# Patient Record
Sex: Female | Born: 1975 | Race: Black or African American | Hispanic: No | Smoking: Never smoker
Health system: Southern US, Community
[De-identification: ages and names within clinical notes are randomized; demographics above are authoritative.]

## PROBLEM LIST (undated history)

## (undated) DIAGNOSIS — Z789 Other specified health status: Secondary | ICD-10-CM

---

## 2019-04-17 ENCOUNTER — Encounter (HOSPITAL_COMMUNITY): Payer: Self-pay | Admitting: Emergency Medicine

## 2019-04-17 ENCOUNTER — Inpatient Hospital Stay (HOSPITAL_COMMUNITY)
Admission: EM | Admit: 2019-04-17 | Discharge: 2019-04-23 | DRG: 492 | Disposition: A | Discharge status: Rehabilitation Facility | Payer: Medicaid Other | Attending: Orthopaedic Surgery | Admitting: Orthopaedic Surgery

## 2019-04-17 DIAGNOSIS — Z823 Family history of stroke: Secondary | ICD-10-CM

## 2019-04-17 DIAGNOSIS — D259 Leiomyoma of uterus, unspecified: Secondary | ICD-10-CM | POA: Diagnosis present

## 2019-04-17 DIAGNOSIS — S82302C Unspecified fracture of lower end of left tibia, initial encounter for open fracture type IIIA, IIIB, or IIIC: Principal | ICD-10-CM | POA: Diagnosis present

## 2019-04-17 DIAGNOSIS — S82832C Other fracture of upper and lower end of left fibula, initial encounter for open fracture type IIIA, IIIB, or IIIC: Secondary | ICD-10-CM | POA: Diagnosis present

## 2019-04-17 DIAGNOSIS — S82402C Unspecified fracture of shaft of left fibula, initial encounter for open fracture type IIIA, IIIB, or IIIC: Secondary | ICD-10-CM

## 2019-04-17 DIAGNOSIS — Z20822 Contact with and (suspected) exposure to covid-19: Secondary | ICD-10-CM | POA: Diagnosis present

## 2019-04-17 DIAGNOSIS — R739 Hyperglycemia, unspecified: Secondary | ICD-10-CM

## 2019-04-17 DIAGNOSIS — D500 Iron deficiency anemia secondary to blood loss (chronic): Secondary | ICD-10-CM | POA: Diagnosis present

## 2019-04-17 DIAGNOSIS — Z6841 Body Mass Index (BMI) 40.0 and over, adult: Secondary | ICD-10-CM

## 2019-04-17 DIAGNOSIS — Z419 Encounter for procedure for purposes other than remedying health state, unspecified: Secondary | ICD-10-CM

## 2019-04-17 DIAGNOSIS — Y92410 Unspecified street and highway as the place of occurrence of the external cause: Secondary | ICD-10-CM

## 2019-04-17 DIAGNOSIS — S62112A Displaced fracture of triquetrum [cuneiform] bone, left wrist, initial encounter for closed fracture: Secondary | ICD-10-CM

## 2019-04-17 DIAGNOSIS — G8918 Other acute postprocedural pain: Secondary | ICD-10-CM

## 2019-04-17 DIAGNOSIS — Z9889 Other specified postprocedural states: Secondary | ICD-10-CM

## 2019-04-17 DIAGNOSIS — D62 Acute posthemorrhagic anemia: Secondary | ICD-10-CM

## 2019-04-17 DIAGNOSIS — Z23 Encounter for immunization: Secondary | ICD-10-CM

## 2019-04-17 DIAGNOSIS — S82202C Unspecified fracture of shaft of left tibia, initial encounter for open fracture type IIIA, IIIB, or IIIC: Secondary | ICD-10-CM | POA: Diagnosis present

## 2019-04-17 HISTORY — DX: Other specified health status: Z78.9

## 2019-04-17 MED ORDER — CEFAZOLIN SODIUM-DEXTROSE 1-4 GM/50ML-% IV SOLN
1.0000 g | Freq: Once | INTRAVENOUS | Status: AC
  Administered 2019-04-18: 1 g via INTRAVENOUS

## 2019-04-17 MED ORDER — SODIUM CHLORIDE 0.9 % IV SOLN: INTRAVENOUS | Status: DC

## 2019-04-17 MED ORDER — TETANUS-DIPHTH-ACELL PERTUSSIS 5-2.5-18.5 LF-MCG/0.5 IM SUSP
0.5000 mL | Freq: Once | INTRAMUSCULAR | Status: AC
  Administered 2019-04-18: 0.5 mL via INTRAMUSCULAR
  Filled 2019-04-17: qty 0.5

## 2019-04-17 MED ORDER — SODIUM CHLORIDE 0.9 % IV BOLUS
1000.0000 mL | Freq: Once | INTRAVENOUS | Status: AC
  Administered 2019-04-18: 1000 mL via INTRAVENOUS

## 2019-04-17 MED ORDER — CEFAZOLIN SODIUM-DEXTROSE 2-4 GM/100ML-% IV SOLN
2.0000 g | Freq: Once | INTRAVENOUS | Status: DC
  Filled 2019-04-17: qty 100

## 2019-04-17 MED ORDER — MORPHINE SULFATE (PF) 4 MG/ML IV SOLN
4.0000 mg | Freq: Once | INTRAVENOUS | Status: AC
  Administered 2019-04-18: 4 mg via INTRAVENOUS
  Filled 2019-04-17: qty 1
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
## 2019-04-18 ENCOUNTER — Emergency Department (HOSPITAL_COMMUNITY): Payer: Medicaid Other

## 2019-04-18 ENCOUNTER — Emergency Department (HOSPITAL_COMMUNITY): Payer: Medicaid Other | Admitting: Anesthesiology

## 2019-04-18 ENCOUNTER — Encounter (HOSPITAL_COMMUNITY)
Admission: EM | Disposition: A | Discharge status: Rehabilitation Facility | Payer: Self-pay | Source: Home / Self Care | Attending: Orthopaedic Surgery

## 2019-04-18 DIAGNOSIS — Z9889 Other specified postprocedural states: Secondary | ICD-10-CM

## 2019-04-18 DIAGNOSIS — S82202C Unspecified fracture of shaft of left tibia, initial encounter for open fracture type IIIA, IIIB, or IIIC: Secondary | ICD-10-CM | POA: Diagnosis present

## 2019-04-18 DIAGNOSIS — Z23 Encounter for immunization: Secondary | ICD-10-CM | POA: Diagnosis not present

## 2019-04-18 DIAGNOSIS — D500 Iron deficiency anemia secondary to blood loss (chronic): Secondary | ICD-10-CM | POA: Diagnosis present

## 2019-04-18 DIAGNOSIS — S82209D Unspecified fracture of shaft of unspecified tibia, subsequent encounter for closed fracture with routine healing: Secondary | ICD-10-CM | POA: Diagnosis not present

## 2019-04-18 DIAGNOSIS — S82401D Unspecified fracture of shaft of right fibula, subsequent encounter for closed fracture with routine healing: Secondary | ICD-10-CM | POA: Diagnosis not present

## 2019-04-18 DIAGNOSIS — D259 Leiomyoma of uterus, unspecified: Secondary | ICD-10-CM | POA: Diagnosis present

## 2019-04-18 DIAGNOSIS — S62112A Displaced fracture of triquetrum [cuneiform] bone, left wrist, initial encounter for closed fracture: Secondary | ICD-10-CM

## 2019-04-18 DIAGNOSIS — R739 Hyperglycemia, unspecified: Secondary | ICD-10-CM | POA: Diagnosis not present

## 2019-04-18 DIAGNOSIS — F329 Major depressive disorder, single episode, unspecified: Secondary | ICD-10-CM | POA: Diagnosis not present

## 2019-04-18 DIAGNOSIS — S82402C Unspecified fracture of shaft of left fibula, initial encounter for open fracture type IIIA, IIIB, or IIIC: Secondary | ICD-10-CM | POA: Diagnosis not present

## 2019-04-18 DIAGNOSIS — S82402S Unspecified fracture of shaft of left fibula, sequela: Secondary | ICD-10-CM | POA: Diagnosis not present

## 2019-04-18 DIAGNOSIS — G8918 Other acute postprocedural pain: Secondary | ICD-10-CM | POA: Diagnosis not present

## 2019-04-18 DIAGNOSIS — S82201D Unspecified fracture of shaft of right tibia, subsequent encounter for closed fracture with routine healing: Secondary | ICD-10-CM | POA: Diagnosis not present

## 2019-04-18 DIAGNOSIS — S82302C Unspecified fracture of lower end of left tibia, initial encounter for open fracture type IIIA, IIIB, or IIIC: Secondary | ICD-10-CM | POA: Diagnosis present

## 2019-04-18 DIAGNOSIS — Z823 Family history of stroke: Secondary | ICD-10-CM | POA: Diagnosis not present

## 2019-04-18 DIAGNOSIS — S82202E Unspecified fracture of shaft of left tibia, subsequent encounter for open fracture type I or II with routine healing: Secondary | ICD-10-CM | POA: Diagnosis not present

## 2019-04-18 DIAGNOSIS — Z20822 Contact with and (suspected) exposure to covid-19: Secondary | ICD-10-CM | POA: Diagnosis present

## 2019-04-18 DIAGNOSIS — S82292E Other fracture of shaft of left tibia, subsequent encounter for open fracture type I or II with routine healing: Secondary | ICD-10-CM | POA: Diagnosis not present

## 2019-04-18 DIAGNOSIS — D62 Acute posthemorrhagic anemia: Secondary | ICD-10-CM | POA: Diagnosis not present

## 2019-04-18 DIAGNOSIS — S82832C Other fracture of upper and lower end of left fibula, initial encounter for open fracture type IIIA, IIIB, or IIIC: Secondary | ICD-10-CM | POA: Diagnosis present

## 2019-04-18 DIAGNOSIS — S82409D Unspecified fracture of shaft of unspecified fibula, subsequent encounter for closed fracture with routine healing: Secondary | ICD-10-CM | POA: Diagnosis not present

## 2019-04-18 DIAGNOSIS — S82202S Unspecified fracture of shaft of left tibia, sequela: Secondary | ICD-10-CM | POA: Diagnosis not present

## 2019-04-18 DIAGNOSIS — K5901 Slow transit constipation: Secondary | ICD-10-CM | POA: Diagnosis not present

## 2019-04-18 DIAGNOSIS — Y92410 Unspecified street and highway as the place of occurrence of the external cause: Secondary | ICD-10-CM | POA: Diagnosis not present

## 2019-04-18 DIAGNOSIS — Z6841 Body Mass Index (BMI) 40.0 and over, adult: Secondary | ICD-10-CM | POA: Diagnosis not present

## 2019-04-18 HISTORY — PX: I & D EXTREMITY: SHX5045

## 2019-04-18 HISTORY — PX: IM NAILING TIBIA: SUR734

## 2019-04-18 HISTORY — PX: TIBIA IM NAIL INSERTION: SHX2516

## 2019-04-18 LAB — CBC
HCT: 30.2 % — ABNORMAL LOW (ref 36.0–46.0)
HCT: 30.7 % — ABNORMAL LOW (ref 36.0–46.0)
Hemoglobin: 8.7 g/dL — ABNORMAL LOW (ref 12.0–15.0)
Hemoglobin: 9 g/dL — ABNORMAL LOW (ref 12.0–15.0)
MCH: 22.8 pg — ABNORMAL LOW (ref 26.0–34.0)
MCH: 23.3 pg — ABNORMAL LOW (ref 26.0–34.0)
MCHC: 28.8 g/dL — ABNORMAL LOW (ref 30.0–36.0)
MCHC: 29.3 g/dL — ABNORMAL LOW (ref 30.0–36.0)
MCV: 79.1 fL — ABNORMAL LOW (ref 80.0–100.0)
MCV: 79.3 fL — ABNORMAL LOW (ref 80.0–100.0)
Platelets: 195 10*3/uL (ref 150–400)
Platelets: 211 10*3/uL (ref 150–400)
RBC: 3.82 MIL/uL — ABNORMAL LOW (ref 3.87–5.11)
RBC: 3.87 MIL/uL (ref 3.87–5.11)
RDW: 17.8 % — ABNORMAL HIGH (ref 11.5–15.5)
RDW: 17.9 % — ABNORMAL HIGH (ref 11.5–15.5)
WBC: 6.5 10*3/uL (ref 4.0–10.5)
WBC: 8.4 10*3/uL (ref 4.0–10.5)
nRBC: 0 % (ref 0.0–0.2)
nRBC: 0 % (ref 0.0–0.2)

## 2019-04-18 LAB — I-STAT CHEM 8, ED
BUN: 15 mg/dL (ref 6–20)
Calcium, Ion: 1.21 mmol/L (ref 1.15–1.40)
Chloride: 105 mmol/L (ref 98–111)
Creatinine, Ser: 0.8 mg/dL (ref 0.44–1.00)
Glucose, Bld: 109 mg/dL — ABNORMAL HIGH (ref 70–99)
HCT: 31 % — ABNORMAL LOW (ref 36.0–46.0)
Hemoglobin: 10.5 g/dL — ABNORMAL LOW (ref 12.0–15.0)
Potassium: 3.8 mmol/L (ref 3.5–5.1)
Sodium: 139 mmol/L (ref 135–145)
TCO2: 27 mmol/L (ref 22–32)

## 2019-04-18 LAB — COMPREHENSIVE METABOLIC PANEL
ALT: 20 U/L (ref 0–44)
AST: 30 U/L (ref 15–41)
Albumin: 3.4 g/dL — ABNORMAL LOW (ref 3.5–5.0)
Alkaline Phosphatase: 74 U/L (ref 38–126)
Anion gap: 10 (ref 5–15)
BUN: 11 mg/dL (ref 6–20)
CO2: 23 mmol/L (ref 22–32)
Calcium: 8.8 mg/dL — ABNORMAL LOW (ref 8.9–10.3)
Chloride: 105 mmol/L (ref 98–111)
Creatinine, Ser: 0.98 mg/dL (ref 0.44–1.00)
GFR calc Af Amer: >60 mL/min (ref >60)
GFR calc non Af Amer: >60 mL/min (ref >60)
Glucose, Bld: 117 mg/dL — ABNORMAL HIGH (ref 70–99)
Potassium: 3.9 mmol/L (ref 3.5–5.1)
Sodium: 138 mmol/L (ref 135–145)
Total Bilirubin: 0.3 mg/dL (ref 0.3–1.2)
Total Protein: 7 g/dL (ref 6.5–8.1)

## 2019-04-18 LAB — RESPIRATORY PANEL BY RT PCR (FLU A&B, COVID)
Influenza A by PCR: NEGATIVE
Influenza B by PCR: NEGATIVE
SARS Coronavirus 2 by RT PCR: NEGATIVE

## 2019-04-18 LAB — BASIC METABOLIC PANEL
Anion gap: 9 (ref 5–15)
BUN: 7 mg/dL (ref 6–20)
CO2: 23 mmol/L (ref 22–32)
Calcium: 8.7 mg/dL — ABNORMAL LOW (ref 8.9–10.3)
Chloride: 105 mmol/L (ref 98–111)
Creatinine, Ser: 0.84 mg/dL (ref 0.44–1.00)
GFR calc Af Amer: >60 mL/min (ref >60)
GFR calc non Af Amer: >60 mL/min (ref >60)
Glucose, Bld: 149 mg/dL — ABNORMAL HIGH (ref 70–99)
Potassium: 3.9 mmol/L (ref 3.5–5.1)
Sodium: 137 mmol/L (ref 135–145)

## 2019-04-18 LAB — PROTIME-INR
INR: 1 (ref 0.8–1.2)
Prothrombin Time: 13.3 seconds (ref 11.4–15.2)

## 2019-04-18 LAB — SAMPLE TO BLOOD BANK

## 2019-04-18 LAB — ETHANOL: Alcohol, Ethyl (B): <10 mg/dL (ref <10)

## 2019-04-18 LAB — I-STAT BETA HCG BLOOD, ED (MC, WL, AP ONLY): I-stat hCG, quantitative: <5 m[IU]/mL (ref <5)

## 2019-04-18 LAB — LACTIC ACID, PLASMA: Lactic Acid, Venous: 2.4 mmol/L (ref 0.5–1.9)

## 2019-04-18 SURGERY — IRRIGATION AND DEBRIDEMENT EXTREMITY
Anesthesia: General | Laterality: Left

## 2019-04-18 MED ORDER — DIPHENHYDRAMINE HCL 12.5 MG/5ML PO ELIX: 12.5000 mg | ORAL_SOLUTION | ORAL | Status: DC | PRN

## 2019-04-18 MED ORDER — GABAPENTIN 100 MG PO CAPS
100.0000 mg | ORAL_CAPSULE | Freq: Three times a day (TID) | ORAL | Status: DC
  Administered 2019-04-18 – 2019-04-23 (×17): 100 mg via ORAL
  Filled 2019-04-18 (×17): qty 1

## 2019-04-18 MED ORDER — PROPOFOL 10 MG/ML IV BOLUS
INTRAVENOUS | Status: AC
  Filled 2019-04-18: qty 20

## 2019-04-18 MED ORDER — OXYCODONE HCL 5 MG PO TABS
10.0000 mg | ORAL_TABLET | ORAL | Status: DC | PRN
  Administered 2019-04-19 – 2019-04-20 (×4): 15 mg via ORAL
  Administered 2019-04-20: 10 mg via ORAL
  Administered 2019-04-21 (×2): 15 mg via ORAL
  Administered 2019-04-22: 10 mg via ORAL
  Administered 2019-04-22: 12:00:00 15 mg via ORAL
  Filled 2019-04-18 (×7): qty 3
  Filled 2019-04-18 (×2): qty 2
  Filled 2019-04-18 (×2): qty 3

## 2019-04-18 MED ORDER — ROCURONIUM BROMIDE 50 MG/5ML IV SOSY
PREFILLED_SYRINGE | INTRAVENOUS | Status: DC | PRN
  Administered 2019-04-18: 50 mg via INTRAVENOUS

## 2019-04-18 MED ORDER — METOCLOPRAMIDE HCL 5 MG PO TABS: 5.0000 mg | ORAL_TABLET | Freq: Three times a day (TID) | ORAL | Status: DC | PRN

## 2019-04-18 MED ORDER — SODIUM CHLORIDE 0.9 % IV SOLN: INTRAVENOUS | Status: DC

## 2019-04-18 MED ORDER — MIDAZOLAM HCL 2 MG/2ML IJ SOLN
INTRAMUSCULAR | Status: AC
  Filled 2019-04-18: qty 2

## 2019-04-18 MED ORDER — ONDANSETRON HCL 4 MG/2ML IJ SOLN: 4.0000 mg | Freq: Once | INTRAMUSCULAR | Status: DC | PRN

## 2019-04-18 MED ORDER — LIDOCAINE 2% (20 MG/ML) 5 ML SYRINGE
INTRAMUSCULAR | Status: DC | PRN
  Administered 2019-04-18: 90 mg via INTRAVENOUS

## 2019-04-18 MED ORDER — HYDROMORPHONE HCL 1 MG/ML IJ SOLN
INTRAMUSCULAR | Status: AC
  Filled 2019-04-18: qty 1

## 2019-04-18 MED ORDER — CHLORHEXIDINE GLUCONATE CLOTH 2 % EX PADS
6.0000 | MEDICATED_PAD | Freq: Every day | CUTANEOUS | Status: DC
  Administered 2019-04-19 – 2019-04-23 (×5): 6 via TOPICAL

## 2019-04-18 MED ORDER — SUGAMMADEX SODIUM 200 MG/2ML IV SOLN
INTRAVENOUS | Status: DC | PRN
  Administered 2019-04-18: 200 mg via INTRAVENOUS

## 2019-04-18 MED ORDER — FENTANYL CITRATE (PF) 250 MCG/5ML IJ SOLN
INTRAMUSCULAR | Status: AC
  Filled 2019-04-18: qty 5

## 2019-04-18 MED ORDER — LACTATED RINGERS IV SOLN: INTRAVENOUS | Status: DC | PRN

## 2019-04-18 MED ORDER — PROPOFOL 10 MG/ML IV BOLUS
INTRAVENOUS | Status: DC | PRN
  Administered 2019-04-18: 110 mg via INTRAVENOUS

## 2019-04-18 MED ORDER — ONDANSETRON HCL 4 MG/2ML IJ SOLN: 4.0000 mg | Freq: Four times a day (QID) | INTRAMUSCULAR | Status: DC | PRN

## 2019-04-18 MED ORDER — CEFAZOLIN SODIUM-DEXTROSE 2-3 GM-%(50ML) IV SOLR
INTRAVENOUS | Status: DC | PRN
  Administered 2019-04-18: 2 g via INTRAVENOUS

## 2019-04-18 MED ORDER — HYDROMORPHONE HCL 1 MG/ML IJ SOLN
0.5000 mg | INTRAMUSCULAR | Status: DC | PRN
  Administered 2019-04-22: 1 mg via INTRAVENOUS
  Filled 2019-04-18 (×2): qty 1

## 2019-04-18 MED ORDER — IOHEXOL 350 MG/ML SOLN
100.0000 mL | Freq: Once | INTRAVENOUS | Status: AC | PRN
  Administered 2019-04-18: 100 mL via INTRAVENOUS

## 2019-04-18 MED ORDER — METHOCARBAMOL 500 MG PO TABS
500.0000 mg | ORAL_TABLET | Freq: Four times a day (QID) | ORAL | Status: DC | PRN
  Administered 2019-04-18 – 2019-04-23 (×6): 500 mg via ORAL
  Filled 2019-04-18 (×7): qty 1

## 2019-04-18 MED ORDER — DOCUSATE SODIUM 100 MG PO CAPS
100.0000 mg | ORAL_CAPSULE | Freq: Two times a day (BID) | ORAL | Status: DC
  Administered 2019-04-18 – 2019-04-23 (×11): 100 mg via ORAL
  Filled 2019-04-18 (×11): qty 1

## 2019-04-18 MED ORDER — METHOCARBAMOL 1000 MG/10ML IJ SOLN: 500.0000 mg | Freq: Four times a day (QID) | INTRAVENOUS | Status: DC | PRN

## 2019-04-18 MED ORDER — HYDROMORPHONE HCL 1 MG/ML IJ SOLN
0.2500 mg | INTRAMUSCULAR | Status: DC | PRN
  Administered 2019-04-18 (×2): 0.5 mg via INTRAVENOUS

## 2019-04-18 MED ORDER — CEFAZOLIN SODIUM-DEXTROSE 2-4 GM/100ML-% IV SOLN
2.0000 g | Freq: Four times a day (QID) | INTRAVENOUS | Status: AC
  Administered 2019-04-18 (×3): 2 g via INTRAVENOUS
  Filled 2019-04-18 (×3): qty 100

## 2019-04-18 MED ORDER — EPHEDRINE SULFATE 50 MG/ML IJ SOLN
INTRAMUSCULAR | Status: DC | PRN
  Administered 2019-04-18 (×2): 5 mg via INTRAVENOUS

## 2019-04-18 MED ORDER — ACETAMINOPHEN 325 MG PO TABS
325.0000 mg | ORAL_TABLET | Freq: Four times a day (QID) | ORAL | Status: DC | PRN
  Administered 2019-04-20 – 2019-04-22 (×3): 650 mg via ORAL
  Filled 2019-04-18 (×4): qty 2

## 2019-04-18 MED ORDER — METOCLOPRAMIDE HCL 5 MG/ML IJ SOLN: 5.0000 mg | Freq: Three times a day (TID) | INTRAMUSCULAR | Status: DC | PRN

## 2019-04-18 MED ORDER — MEPERIDINE HCL 25 MG/ML IJ SOLN: 6.2500 mg | INTRAMUSCULAR | Status: DC | PRN

## 2019-04-18 MED ORDER — FENTANYL CITRATE (PF) 100 MCG/2ML IJ SOLN
INTRAMUSCULAR | Status: DC | PRN
  Administered 2019-04-18: 25 ug via INTRAVENOUS
  Administered 2019-04-18: 50 ug via INTRAVENOUS
  Administered 2019-04-18: 100 ug via INTRAVENOUS
  Administered 2019-04-18: 25 ug via INTRAVENOUS
  Administered 2019-04-18: 50 ug via INTRAVENOUS

## 2019-04-18 MED ORDER — SUCCINYLCHOLINE CHLORIDE 20 MG/ML IJ SOLN
INTRAMUSCULAR | Status: DC | PRN
  Administered 2019-04-18: 120 mg via INTRAVENOUS

## 2019-04-18 MED ORDER — PANTOPRAZOLE SODIUM 40 MG PO TBEC
40.0000 mg | DELAYED_RELEASE_TABLET | Freq: Every day | ORAL | Status: DC
  Administered 2019-04-18 – 2019-04-23 (×6): 40 mg via ORAL
  Filled 2019-04-18 (×6): qty 1

## 2019-04-18 MED ORDER — OXYCODONE HCL 5 MG PO TABS
5.0000 mg | ORAL_TABLET | ORAL | Status: DC | PRN
  Administered 2019-04-18 (×3): 5 mg via ORAL
  Administered 2019-04-19 – 2019-04-23 (×6): 10 mg via ORAL
  Filled 2019-04-18 (×3): qty 2
  Filled 2019-04-18 (×3): qty 1
  Filled 2019-04-18 (×2): qty 2

## 2019-04-18 MED ORDER — ONDANSETRON HCL 4 MG/2ML IJ SOLN
INTRAMUSCULAR | Status: DC | PRN
  Administered 2019-04-18: 4 mg via INTRAVENOUS

## 2019-04-18 MED ORDER — ONDANSETRON HCL 4 MG PO TABS: 4.0000 mg | ORAL_TABLET | Freq: Four times a day (QID) | ORAL | Status: DC | PRN

## 2019-04-18 MED ORDER — DEXAMETHASONE SODIUM PHOSPHATE 10 MG/ML IJ SOLN
INTRAMUSCULAR | Status: DC | PRN
  Administered 2019-04-18: 5 mg via INTRAVENOUS

## 2019-04-18 MED ORDER — MIDAZOLAM HCL 5 MG/5ML IJ SOLN
INTRAMUSCULAR | Status: DC | PRN
  Administered 2019-04-18: 2 mg via INTRAVENOUS

## 2019-04-18 MED ORDER — PHENYLEPHRINE HCL-NACL 10-0.9 MG/250ML-% IV SOLN
INTRAVENOUS | Status: DC | PRN
  Administered 2019-04-18: 25 ug/min via INTRAVENOUS

## 2019-04-18 SURGICAL SUPPLY — 75 items
BIT DRILL 3.8X6 NS (BIT) ×6 IMPLANT
BIT DRILL 4.4 NS (BIT) ×3 IMPLANT
BLADE SURG 10 STRL SS (BLADE) ×3 IMPLANT
BNDG COHESIVE 4X5 TAN STRL (GAUZE/BANDAGES/DRESSINGS) ×3 IMPLANT
BNDG COHESIVE 6X5 TAN STRL LF (GAUZE/BANDAGES/DRESSINGS) ×9 IMPLANT
BNDG ELASTIC 3X5.8 VLCR STR LF (GAUZE/BANDAGES/DRESSINGS) IMPLANT
BNDG ELASTIC 4X5.8 VLCR STR LF (GAUZE/BANDAGES/DRESSINGS) ×3 IMPLANT
BNDG ELASTIC 6X10 VLCR STRL LF (GAUZE/BANDAGES/DRESSINGS) ×3 IMPLANT
BNDG ELASTIC 6X5.8 VLCR STR LF (GAUZE/BANDAGES/DRESSINGS) ×6 IMPLANT
BNDG GAUZE ELAST 4 BULKY (GAUZE/BANDAGES/DRESSINGS) ×3 IMPLANT
COVER MAYO STAND STRL (DRAPES) ×3 IMPLANT
COVER SURGICAL LIGHT HANDLE (MISCELLANEOUS) ×6 IMPLANT
DRAPE C-ARM 42X72 X-RAY (DRAPES) ×3 IMPLANT
DRAPE HALF SHEET 40X57 (DRAPES) ×3 IMPLANT
DRAPE IMP U-DRAPE 54X76 (DRAPES) ×3 IMPLANT
DRAPE INCISE IOBAN 66X45 STRL (DRAPES) IMPLANT
DRAPE ORTHO SPLIT 77X108 STRL (DRAPES) ×4
DRAPE SURG 17X23 STRL (DRAPES) IMPLANT
DRAPE SURG ORHT 6 SPLT 77X108 (DRAPES) ×2 IMPLANT
DRAPE U-SHAPE 47X51 STRL (DRAPES) ×3 IMPLANT
ELECT REM PT RETURN 9FT ADLT (ELECTROSURGICAL) ×3
ELECTRODE REM PT RTRN 9FT ADLT (ELECTROSURGICAL) ×1 IMPLANT
GAUZE SPONGE 4X4 12PLY STRL (GAUZE/BANDAGES/DRESSINGS) ×3 IMPLANT
GAUZE SPONGE 4X4 12PLY STRL LF (GAUZE/BANDAGES/DRESSINGS) ×3 IMPLANT
GAUZE XEROFORM 5X9 LF (GAUZE/BANDAGES/DRESSINGS) ×3 IMPLANT
GLOVE BIO SURGEON STRL SZ 6.5 (GLOVE) ×2 IMPLANT
GLOVE BIO SURGEON STRL SZ8 (GLOVE) ×3 IMPLANT
GLOVE BIO SURGEONS STRL SZ 6.5 (GLOVE) ×1
GLOVE BIOGEL PI IND STRL 8 (GLOVE) ×3 IMPLANT
GLOVE BIOGEL PI INDICATOR 8 (GLOVE) ×6
GLOVE ORTHO TXT STRL SZ7.5 (GLOVE) ×6 IMPLANT
GOWN STRL REUS W/ TWL LRG LVL3 (GOWN DISPOSABLE) ×1 IMPLANT
GOWN STRL REUS W/ TWL XL LVL3 (GOWN DISPOSABLE) ×4 IMPLANT
GOWN STRL REUS W/TWL LRG LVL3 (GOWN DISPOSABLE) ×2
GOWN STRL REUS W/TWL XL LVL3 (GOWN DISPOSABLE) ×8
GUIDEPIN 3.2X17.5 THRD DISP (PIN) ×3 IMPLANT
GUIDEWIRE BALL NOSE 80CM (WIRE) ×3 IMPLANT
HANDPIECE INTERPULSE COAX TIP (DISPOSABLE) ×2
KIT BASIN OR (CUSTOM PROCEDURE TRAY) ×3 IMPLANT
KIT TURNOVER KIT B (KITS) ×3 IMPLANT
MANIFOLD NEPTUNE II (INSTRUMENTS) ×3 IMPLANT
NAIL TIBIAL 9MMX34.5CM (Nail) ×3 IMPLANT
NS IRRIG 1000ML POUR BTL (IV SOLUTION) ×3 IMPLANT
PACK ORTHO EXTREMITY (CUSTOM PROCEDURE TRAY) ×3 IMPLANT
PACK UNIVERSAL I (CUSTOM PROCEDURE TRAY) ×3 IMPLANT
PAD ARMBOARD 7.5X6 YLW CONV (MISCELLANEOUS) ×6 IMPLANT
PAD CAST 4YDX4 CTTN HI CHSV (CAST SUPPLIES) ×1 IMPLANT
PADDING CAST ABS 4INX4YD NS (CAST SUPPLIES) ×4
PADDING CAST ABS COTTON 4X4 ST (CAST SUPPLIES) ×2 IMPLANT
PADDING CAST COTTON 4X4 STRL (CAST SUPPLIES) ×2
PADDING CAST COTTON 6X4 STRL (CAST SUPPLIES) ×6 IMPLANT
SCREW ACECAP 34MM (Screw) ×3 IMPLANT
SCREW ACECAP 42MM (Screw) ×3 IMPLANT
SCREW PROXIMAL DEPUY (Screw) ×4 IMPLANT
SCREW PRXML FT 55X5.5XNS TIB (Screw) ×2 IMPLANT
SET HNDPC FAN SPRY TIP SCT (DISPOSABLE) ×1 IMPLANT
SPONGE LAP 18X18 RF (DISPOSABLE) ×3 IMPLANT
SPONGE LAP 4X18 RFD (DISPOSABLE) ×3 IMPLANT
STAPLER VISISTAT 35W (STAPLE) ×3 IMPLANT
STOCKINETTE IMPERVIOUS 9X36 MD (GAUZE/BANDAGES/DRESSINGS) ×3 IMPLANT
STOCKINETTE IMPERVIOUS LG (DRAPES) IMPLANT
SUT ETHILON 2 0 FS 18 (SUTURE) ×12 IMPLANT
SUT VIC AB 0 CT1 27 (SUTURE) ×2
SUT VIC AB 0 CT1 27XBRD ANBCTR (SUTURE) ×1 IMPLANT
SUT VIC AB 1 CT1 27 (SUTURE) ×2
SUT VIC AB 1 CT1 27XBRD ANTBC (SUTURE) ×1 IMPLANT
SUT VIC AB 2-0 CT1 27 (SUTURE) ×2
SUT VIC AB 2-0 CT1 TAPERPNT 27 (SUTURE) ×1 IMPLANT
SWAB CULTURE ESWAB REG 1ML (MISCELLANEOUS) IMPLANT
TOWEL GREEN STERILE (TOWEL DISPOSABLE) ×3 IMPLANT
TOWEL GREEN STERILE FF (TOWEL DISPOSABLE) ×3 IMPLANT
TUBE CONNECTING 12'X1/4 (SUCTIONS) ×1
TUBE CONNECTING 12X1/4 (SUCTIONS) ×2 IMPLANT
UNDERPAD 30X30 (UNDERPADS AND DIAPERS) ×3 IMPLANT
YANKAUER SUCT BULB TIP NO VENT (SUCTIONS) ×6 IMPLANT

## 2019-04-18 NOTE — Brief Op Note (Signed)
04/17/2019 - 04/18/2019  4:53 AM  PATIENT:  Sarah Bennett  44 y.o. female  PRE-OPERATIVE DIAGNOSIS:  LEFT OPEN TIBIA FRACTURE  POST-OPERATIVE DIAGNOSIS:  LEFT OPEN TIBIA FRACTURE  PROCEDURE:  Procedure(s): IRRIGATION AND DEBRIDEMENT OPEN TIB/FIB (Left) INTRAMEDULLARY (IM) NAIL TIBIAL (Left)  SURGEON:  Surgeon(s) and Role:    Mcarthur Rossetti, MD - Primary  PHYSICIAN ASSISTANT: Benita Stabile, PA-C  ANESTHESIA:   general  EBL:  100 mL   COUNTS:  YES  DICTATION: .Other Dictation: Dictation Number [id number]  PLAN OF CARE: Admit to inpatient   PATIENT DISPOSITION:  PACU - hemodynamically stable.   Delay start of Pharmacological VTE agent (>24hrs) due to surgical blood loss or risk of bleeding: no

## 2019-04-18 NOTE — Progress Notes (Signed)
Orthopedic Tech Progress Note Patient Details:  Sarah Bennett 1975/07/17 [id number]  Ortho Devices Type of Ortho Device: Post (short leg) splint, Stirrup splint Ortho Device/Splint Location: lle. I applied a posterior and stirrup to lle at drs request. Ortho Device/Splint Interventions: Ordered, Adjustment, Application   Post Interventions Patient Tolerated: Well Instructions Provided: Care of device, Adjustment of device   Karolee Stamps 04/18/2019, 1:03 AM

## 2019-04-18 NOTE — Anesthesia Procedure Notes (Signed)
Procedure Name: Intubation Date/Time: 04/18/2019 3:06 AM Performed by: Suzy Bouchard, CRNA Pre-anesthesia Checklist: Patient identified, Emergency Drugs available, Suction available, Patient being monitored and Timeout performed Patient Re-evaluated:Patient Re-evaluated prior to induction Oxygen Delivery Method: Circle system utilized Preoxygenation: Pre-oxygenation with 100% oxygen Induction Type: IV induction, Rapid sequence and Cricoid Pressure applied Laryngoscope Size: Miller and 2 Grade View: Grade III Tube type: Oral Tube size: 7.5 mm Number of attempts: 2 Airway Equipment and Method: Stylet Placement Confirmation: ETT inserted through vocal cords under direct vision,  positive ETCO2 and breath sounds checked- equal and bilateral Secured at: 22 cm Tube secured with: Tape Dental Injury: Teeth and Oropharynx as per pre-operative assessment

## 2019-04-18 NOTE — Plan of Care (Signed)
  Problem: Education: Goal: Knowledge of General Education information will improve Description: Including pain rating scale, medication(s)/side effects and non-pharmacologic comfort measures Outcome: Progressing   Problem: Pain Managment: Goal: General experience of comfort will improve Outcome: Progressing   Problem: Safety: Goal: Ability to remain free from injury will improve Outcome: Progressing   

## 2019-04-18 NOTE — Progress Notes (Signed)
Subjective: Day of Surgery Procedure(s) (LRB): IRRIGATION AND DEBRIDEMENT OPEN TIB/FIB (Left) INTRAMEDULLARY (IM) NAIL TIBIAL (Left) Patient reports pain as moderate.  She tolerated surgery well and is awake and alert and following commands appropriately this morning.  A CBC and BMET are pending.  Of note, she does have a nondisplaced small avulsion fracture of the triquetrum.  This is just being treated in a Velcro wrist splint.  Objective: Vital signs in last 24 hours: Temp:  [97.8 F (36.6 C)-98.8 F (37.1 C)] 98.4 F (36.9 C) (04/11 0745) Pulse Rate:  [62-80] 65 (04/11 0745) Resp:  [11-25] 16 (04/11 0745) BP: (120-180)/(51-83) 140/75 (04/11 0745) SpO2:  [94 %-100 %] 100 % (04/11 0745) Weight:  [113.4 kg] 113.4 kg (04/10 2346)  Intake/Output from previous day: 04/10 0701 - 04/11 0700 In: 950 [I.V.:900; IV Piggyback:50] Out: 825 [Urine:725; Blood:100] Intake/Output this shift: No intake/output data recorded.  Recent Labs    04/17/19 2353 04/18/19 0012  HGB 9.0* 10.5*   Recent Labs    04/17/19 2353 04/18/19 0012  WBC 6.5  --   RBC 3.87  --   HCT 30.7* 31.0*  PLT 211  --    Recent Labs    04/17/19 2353 04/18/19 0012  NA 138 139  K 3.9 3.8  CL 105 105  CO2 23  --   BUN 11 15  CREATININE 0.98 0.80  GLUCOSE 117* 109*  CALCIUM 8.8*  --    Recent Labs    04/17/19 2353  INR 1.0    Sensation intact distally Intact pulses distally Dorsiflexion/Plantar flexion intact Incision: dressing C/D/I Compartment soft   Assessment/Plan: Day of Surgery Procedure(s) (LRB): IRRIGATION AND DEBRIDEMENT OPEN TIB/FIB (Left) INTRAMEDULLARY (IM) NAIL TIBIAL (Left) Up with therapy - TDWB only left LE; can put weight thru left wrist      Mcarthur Rossetti 04/18/2019, 8:21 AM

## 2019-04-18 NOTE — Progress Notes (Signed)
Rehab Admissions Coordinator Note:  Per PT and OT recommendation, this patient was screened by Raechel Ache for appropriateness for an Inpatient Acute Rehab Consult.  At this time, we are recommending Inpatient Rehab consult. Please have attending MD place consult order if he would like for this patient to be considered for CIR.   Raechel Ache 04/18/2019, 5:17 PM  I can be reached at [phone].

## 2019-04-18 NOTE — Anesthesia Postprocedure Evaluation (Signed)
Anesthesia Post Note  Patient: Sarah Bennett  Procedure(s) Performed: IRRIGATION AND DEBRIDEMENT OPEN TIB/FIB (Left ) INTRAMEDULLARY (IM) NAIL TIBIAL (Left )     Patient location during evaluation: PACU Anesthesia Type: General Level of consciousness: awake and alert Pain management: pain level controlled Vital Signs Assessment: post-procedure vital signs reviewed and stable Respiratory status: spontaneous breathing, nonlabored ventilation, respiratory function stable and patient connected to nasal cannula oxygen Cardiovascular status: blood pressure returned to baseline and stable Postop Assessment: no apparent nausea or vomiting Anesthetic complications: no    Last Vitals:  Vitals:   04/18/19 0545 04/18/19 0552  BP: (!) 120/51 128/81  Pulse: 67 62  Resp: 15 15  Temp: 36.7 C   SpO2: 96% 94%    Last Pain:  Vitals:   04/18/19 0545  TempSrc:   PainSc: [name]
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
## 2019-04-18 NOTE — Transfer of Care (Signed)
Immediate Anesthesia Transfer of Care Note  Patient: Sarah Bennett  Procedure(s) Performed: IRRIGATION AND DEBRIDEMENT OPEN TIB/FIB (Left ) INTRAMEDULLARY (IM) NAIL TIBIAL (Left )  Patient Location: PACU  Anesthesia Type:General  Level of Consciousness: awake, alert  and oriented  Airway & Oxygen Therapy: Patient Spontanous Breathing and Patient connected to nasal cannula oxygen  Post-op Assessment: Report given to RN and Post -op Vital signs reviewed and stable  Post vital signs: Reviewed and stable  Last Vitals:  Vitals Value Taken Time  BP 130/72 04/18/19 0523  Temp 37.1 C 04/18/19 0520  Pulse 78 04/18/19 0525  Resp 17 04/18/19 0525  SpO2 100 % 04/18/19 0525  Vitals shown include unvalidated device data.  Last Pain:  Vitals:   04/18/19 0203  TempSrc:   PainSc: 6          Complications: No apparent anesthesia complications

## 2019-04-18 NOTE — Progress Notes (Signed)
Orthopedic Tech Progress Note Patient Details:  Sarah Bennett 07-22-1975 [id number]  Ortho Devices Type of Ortho Device: Velcro wrist splint Ortho Device/Splint Location: lue. When i arrived to apply the thumb spica Dr. Ninfa Linden was in the room and requested a velcro wrist splint instead. Ortho Device/Splint Interventions: Ordered, Application, Adjustment   Post Interventions Patient Tolerated: Well Instructions Provided: Care of device, Adjustment of device   Karolee Stamps 04/18/2019, 2:05 AM
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
## 2019-04-18 NOTE — Anesthesia Preprocedure Evaluation (Addendum)
Anesthesia Evaluation  Patient identified by MRN, date of birth, ID band Patient awake    Reviewed: Allergy & Precautions, NPO status , Patient's Chart, lab work & pertinent test results  Airway Mallampati: I  TM Distance: >3 FB Neck ROM: Full    Dental  (+) Dental Advisory Given, Teeth Intact   Pulmonary    Pulmonary exam normal        Cardiovascular Normal cardiovascular exam     Neuro/Psych    GI/Hepatic   Endo/Other    Renal/GU      Musculoskeletal   Abdominal   Peds  Hematology   Anesthesia Other Findings   Reproductive/Obstetrics                            Anesthesia Physical Anesthesia Plan  ASA: II and emergent  Anesthesia Plan: General   Post-op Pain Management:    Induction: Intravenous, Rapid sequence and Cricoid pressure planned  PONV Risk Score and Plan: 3 and Ondansetron, Dexamethasone and Midazolam  Airway Management Planned: Oral ETT  Additional Equipment:   Intra-op Plan:   Post-operative Plan: Extubation in OR  Informed Consent: I have reviewed the patients History and Physical, chart, labs and discussed the procedure including the risks, benefits and alternatives for the proposed anesthesia with the patient or authorized representative who has indicated his/her understanding and acceptance.       Plan Discussed with: CRNA and Surgeon  Anesthesia Plan Comments:         Anesthesia Quick Evaluation

## 2019-04-19 ENCOUNTER — Other Ambulatory Visit: Payer: Self-pay

## 2019-04-19 ENCOUNTER — Encounter (HOSPITAL_COMMUNITY): Payer: Self-pay | Admitting: Orthopaedic Surgery

## 2019-04-19 LAB — URINALYSIS, ROUTINE W REFLEX MICROSCOPIC
Bilirubin Urine: NEGATIVE
Glucose, UA: NEGATIVE mg/dL
Hgb urine dipstick: NEGATIVE
Ketones, ur: NEGATIVE mg/dL
Leukocytes,Ua: NEGATIVE
Nitrite: NEGATIVE
Protein, ur: NEGATIVE mg/dL
Specific Gravity, Urine: 1.011 (ref 1.005–1.030)
pH: 6 (ref 5.0–8.0)

## 2019-04-19 NOTE — Plan of Care (Signed)
  Problem: Education: Goal: Knowledge of General Education information will improve Description: Including pain rating scale, medication(s)/side effects and non-pharmacologic comfort measures Outcome: Progressing   Problem: Activity: Goal: Risk for activity intolerance will decrease Outcome: Progressing   Problem: Pain Managment: Goal: General experience of comfort will improve Outcome: Progressing   
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
## 2019-04-19 NOTE — Progress Notes (Signed)
Subjective: 1 Day Post-Op Procedure(s) (LRB): IRRIGATION AND DEBRIDEMENT OPEN TIB/FIB (Left) INTRAMEDULLARY (IM) NAIL TIBIAL (Left) Patient reports pain as moderate.  Her vitals are stable.  PT and OT have seen her once and have recommended considering Inpatient Rehab.  Objective: Vital signs in last 24 hours: Temp:  [98.3 F (36.8 C)-98.9 F (37.2 C)] 98.9 F (37.2 C) (04/12 0303) Pulse Rate:  [79-81] 81 (04/12 0303) Resp:  [16-17] 17 (04/12 0303) BP: (125-134)/(58-73) 134/60 (04/12 0303) SpO2:  [95 %-100 %] 95 % (04/12 0303)  Intake/Output from previous day: 04/11 0701 - 04/12 0700 In: 1110 [P.O.:360; I.V.:750] Out: 3000 [Urine:3000] Intake/Output this shift: No intake/output data recorded.  Recent Labs    04/17/19 2353 04/18/19 0012 04/18/19 0750  HGB 9.0* 10.5* 8.7*   Recent Labs    04/17/19 2353 04/17/19 2353 04/18/19 0012 04/18/19 0750  WBC 6.5  --   --  8.4  RBC 3.87  --   --  3.82*  HCT 30.7*   < > 31.0* 30.2*  PLT 211  --   --  195   < > = values in this interval not displayed.   Recent Labs    04/17/19 2353 04/17/19 2353 04/18/19 0012 04/18/19 0750  NA 138   < > 139 137  K 3.9   < > 3.8 3.9  CL 105   < > 105 105  CO2 23  --   --  23  BUN 11   < > 15 7  CREATININE 0.98   < > 0.80 0.84  GLUCOSE 117*   < > 109* 149*  CALCIUM 8.8*  --   --  8.7*   < > = values in this interval not displayed.   Recent Labs    04/17/19 2353  INR 1.0    Sensation intact distally Intact pulses distally Dorsiflexion/Plantar flexion intact Incision: dressing C/D/I Compartment soft   Assessment/Plan: 1 Day Post-Op Procedure(s) (LRB): IRRIGATION AND DEBRIDEMENT OPEN TIB/FIB (Left) INTRAMEDULLARY (IM) NAIL TIBIAL (Left) Up with therapy - TDWB only left leg Removable left wrist splint - can bear weight as comfort allows thru left wrist. Will put in for consult by Rehab MD.      Mcarthur Rossetti 04/19/2019, 7:51 AM

## 2019-04-19 NOTE — Plan of Care (Signed)
  Problem: Education: Goal: Knowledge of General Education information will improve Description Including pain rating scale, medication(s)/side effects and non-pharmacologic comfort measures Outcome: Progressing   
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
## 2019-04-19 NOTE — Progress Notes (Signed)
Inpatient Rehab Admissions:  Inpatient Rehab Consult received.  I met with pt at the bedside for rehabilitation assessment and to discuss goals and expectations of an inpatient rehab admission. Feel pt would benefit from IP Rehab program but am concerned about her lack of support at DC as she mentioned her daughter works 2-11 pm and attends school Mon-Wed-Fri 10-12:30. Pt also lives on the 2nd story of her apartment complex and would need to be able to safely manage stairs. Will follow up with pt's daughter tomorrow to determine if there is anyone else who can assist her mom at DC as pt would need to achieve a Mod I level prior to returning home.   Will follow up tomorrow.   Kelly Gentry, OTR/L  Rehab Admissions Coordinator  [phone] 04/19/2019 5:33 PM  
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
## 2019-04-20 NOTE — Plan of Care (Signed)

## 2019-04-20 NOTE — Progress Notes (Signed)
Patient ID: Sarah Bennett, female   DOB: Jul 23, 1975, 44 y.o.   MRN: [id number] Doing well overall.  PT/OT have recommended CIR.  Can go there this afternoon if approved.  I'll change her left leg dressing this afternoon.  Her left leg is otherwise stable thus far.

## 2019-04-20 NOTE — Progress Notes (Signed)
Inpatient Rehabilitation-Admissions Coordinator   Met with pt bedside. Pt still interested in CIR at this time. Pt's daughter able to assist intermittently at DC. Based on pt's functional progress today, anticipate she can reach Mod I level through CIR. Feel pt remains an appropriate candidate for CIR. Unfortunately I do not have a bed available for her today. Will follow up tomorrow for possible admit, pending bed availability.   Raechel Ache, OTR/L  Rehab Admissions Coordinator  [phone] 04/20/2019 10:44 AM
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
## 2019-04-20 NOTE — Plan of Care (Signed)
  Problem: Clinical Measurements: Goal: Will remain free from infection Outcome: Progressing   Problem: Activity: Goal: Risk for activity intolerance will decrease Outcome: Progressing   Problem: Nutrition: Goal: Adequate nutrition will be maintained Outcome: Progressing   Problem: Coping: Goal: Level of anxiety will decrease Outcome: Progressing   
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
## 2019-04-21 NOTE — Progress Notes (Signed)
Patient ID: Sarah Bennett, female   DOB: [birth year], 44 y.o.   MRN: [id number] The patient is doing well overall.  Her vital signs are stable.  She is medically stable.  Her left leg dressings evolved and changed her incisions look good.  Her foot is well-perfused.  Her disposition is pending insurance approval and bed availability for inpatient rehab.  Inpatient rehab was recommended to her by therapy here.  Hopefully she will be able to be discharged to there today.

## 2019-04-21 NOTE — Progress Notes (Signed)
Inpatient Rehabilitation-Admissions Coordinator   I do not have a bed available for this patient today. Will follow up tomorrow for possible admit, pending bed availability. TOC team aware.   Raechel Ache, OTR/L  Rehab Admissions Coordinator  [phone] 04/21/2019 1:50 PM
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
## 2019-04-22 NOTE — Progress Notes (Signed)
Patient ID: Sarah Bennett, female   DOB: 1975/05/26, 44 y.o.   MRN: [id number] No acute changes.  Left leg stable.  Awaiting disposition from inpatient rehab.  Can be discharged to there when bed available.

## 2019-04-22 NOTE — Progress Notes (Signed)
Inpatient Rehabilitation-Admissions Coordinator   I do not have a bed available for this patient today. Will follow up tomorrow for possible admission, pending bed availability.   Raechel Ache, OTR/L  Rehab Admissions Coordinator  [phone] 04/22/2019 9:58 AM

## 2019-04-23 ENCOUNTER — Telehealth: Payer: Self-pay | Admitting: Radiology

## 2019-04-23 ENCOUNTER — Encounter (HOSPITAL_COMMUNITY): Payer: Self-pay | Admitting: Orthopaedic Surgery

## 2019-04-23 ENCOUNTER — Inpatient Hospital Stay (HOSPITAL_COMMUNITY)
Admission: RE | Admit: 2019-04-23 | Discharge: 2019-05-01 | DRG: 560 | Disposition: A | Discharge status: Home Health | Payer: Medicaid Other | Source: Intra-hospital | Attending: Physical Medicine & Rehabilitation | Admitting: Physical Medicine & Rehabilitation

## 2019-04-23 DIAGNOSIS — S82409A Unspecified fracture of shaft of unspecified fibula, initial encounter for closed fracture: Secondary | ICD-10-CM | POA: Diagnosis present

## 2019-04-23 DIAGNOSIS — Z823 Family history of stroke: Secondary | ICD-10-CM

## 2019-04-23 DIAGNOSIS — Y92410 Unspecified street and highway as the place of occurrence of the external cause: Secondary | ICD-10-CM

## 2019-04-23 DIAGNOSIS — R609 Edema, unspecified: Secondary | ICD-10-CM | POA: Diagnosis not present

## 2019-04-23 DIAGNOSIS — S82202E Unspecified fracture of shaft of left tibia, subsequent encounter for open fracture type I or II with routine healing: Secondary | ICD-10-CM | POA: Diagnosis present

## 2019-04-23 DIAGNOSIS — S82209D Unspecified fracture of shaft of unspecified tibia, subsequent encounter for closed fracture with routine healing: Secondary | ICD-10-CM | POA: Diagnosis not present

## 2019-04-23 DIAGNOSIS — S62115D Nondisplaced fracture of triquetrum [cuneiform] bone, left wrist, subsequent encounter for fracture with routine healing: Secondary | ICD-10-CM | POA: Diagnosis not present

## 2019-04-23 DIAGNOSIS — Z9889 Other specified postprocedural states: Secondary | ICD-10-CM

## 2019-04-23 DIAGNOSIS — R739 Hyperglycemia, unspecified: Secondary | ICD-10-CM

## 2019-04-23 DIAGNOSIS — S82202S Unspecified fracture of shaft of left tibia, sequela: Secondary | ICD-10-CM | POA: Diagnosis not present

## 2019-04-23 DIAGNOSIS — K5900 Constipation, unspecified: Secondary | ICD-10-CM | POA: Diagnosis present

## 2019-04-23 DIAGNOSIS — S82292E Other fracture of shaft of left tibia, subsequent encounter for open fracture type I or II with routine healing: Principal | ICD-10-CM

## 2019-04-23 DIAGNOSIS — S82402S Unspecified fracture of shaft of left fibula, sequela: Secondary | ICD-10-CM | POA: Diagnosis not present

## 2019-04-23 DIAGNOSIS — Z6841 Body Mass Index (BMI) 40.0 and over, adult: Secondary | ICD-10-CM

## 2019-04-23 DIAGNOSIS — S82201D Unspecified fracture of shaft of right tibia, subsequent encounter for closed fracture with routine healing: Secondary | ICD-10-CM

## 2019-04-23 DIAGNOSIS — K5901 Slow transit constipation: Secondary | ICD-10-CM | POA: Diagnosis not present

## 2019-04-23 DIAGNOSIS — D62 Acute posthemorrhagic anemia: Secondary | ICD-10-CM | POA: Diagnosis present

## 2019-04-23 DIAGNOSIS — F3289 Other specified depressive episodes: Secondary | ICD-10-CM | POA: Diagnosis present

## 2019-04-23 DIAGNOSIS — F329 Major depressive disorder, single episode, unspecified: Secondary | ICD-10-CM | POA: Diagnosis not present

## 2019-04-23 DIAGNOSIS — S82209A Unspecified fracture of shaft of unspecified tibia, initial encounter for closed fracture: Secondary | ICD-10-CM | POA: Diagnosis present

## 2019-04-23 DIAGNOSIS — S82492E Other fracture of shaft of left fibula, subsequent encounter for open fracture type I or II with routine healing: Secondary | ICD-10-CM | POA: Diagnosis not present

## 2019-04-23 DIAGNOSIS — S82401D Unspecified fracture of shaft of right fibula, subsequent encounter for closed fracture with routine healing: Secondary | ICD-10-CM

## 2019-04-23 DIAGNOSIS — F419 Anxiety disorder, unspecified: Secondary | ICD-10-CM | POA: Diagnosis present

## 2019-04-23 DIAGNOSIS — S82409D Unspecified fracture of shaft of unspecified fibula, subsequent encounter for closed fracture with routine healing: Secondary | ICD-10-CM | POA: Diagnosis not present

## 2019-04-23 DIAGNOSIS — S62112A Displaced fracture of triquetrum [cuneiform] bone, left wrist, initial encounter for closed fracture: Secondary | ICD-10-CM | POA: Diagnosis not present

## 2019-04-23 DIAGNOSIS — G8918 Other acute postprocedural pain: Secondary | ICD-10-CM

## 2019-04-23 DIAGNOSIS — S82202C Unspecified fracture of shaft of left tibia, initial encounter for open fracture type IIIA, IIIB, or IIIC: Secondary | ICD-10-CM | POA: Diagnosis not present

## 2019-04-23 MED ORDER — PANTOPRAZOLE SODIUM 40 MG PO TBEC
40.0000 mg | DELAYED_RELEASE_TABLET | Freq: Every day | ORAL | Status: DC
  Administered 2019-04-24 – 2019-05-01 (×8): 40 mg via ORAL
  Filled 2019-04-23 (×8): qty 1

## 2019-04-23 MED ORDER — METHOCARBAMOL 1000 MG/10ML IJ SOLN
500.0000 mg | Freq: Four times a day (QID) | INTRAVENOUS | Status: DC | PRN
  Filled 2019-04-23: qty 5

## 2019-04-23 MED ORDER — METHOCARBAMOL 500 MG PO TABS: 500.0000 mg | ORAL_TABLET | Freq: Four times a day (QID) | ORAL | 0 refills | Status: DC | PRN

## 2019-04-23 MED ORDER — ALUM & MAG HYDROXIDE-SIMETH 200-200-20 MG/5ML PO SUSP: 30.0000 mL | ORAL | Status: DC | PRN

## 2019-04-23 MED ORDER — GUAIFENESIN-DM 100-10 MG/5ML PO SYRP: 5.0000 mL | ORAL_SOLUTION | Freq: Four times a day (QID) | ORAL | Status: DC | PRN

## 2019-04-23 MED ORDER — TRAZODONE HCL 50 MG PO TABS: 25.0000 mg | ORAL_TABLET | Freq: Every evening | ORAL | Status: DC | PRN

## 2019-04-23 MED ORDER — ENOXAPARIN SODIUM 40 MG/0.4ML ~~LOC~~ SOLN
40.0000 mg | SUBCUTANEOUS | Status: DC
  Administered 2019-04-23 – 2019-04-30 (×8): 40 mg via SUBCUTANEOUS
  Filled 2019-04-23 (×8): qty 0.4

## 2019-04-23 MED ORDER — CHLORHEXIDINE GLUCONATE CLOTH 2 % EX PADS
6.0000 | MEDICATED_PAD | Freq: Every day | CUTANEOUS | Status: DC
  Administered 2019-04-26: 6 via TOPICAL

## 2019-04-23 MED ORDER — DOCUSATE SODIUM 100 MG PO CAPS
100.0000 mg | ORAL_CAPSULE | Freq: Two times a day (BID) | ORAL | Status: DC
  Administered 2019-04-23 – 2019-04-26 (×6): 100 mg via ORAL
  Filled 2019-04-23 (×6): qty 1

## 2019-04-23 MED ORDER — PROCHLORPERAZINE 25 MG RE SUPP: 12.5000 mg | Freq: Four times a day (QID) | RECTAL | Status: DC | PRN

## 2019-04-23 MED ORDER — DIPHENHYDRAMINE HCL 12.5 MG/5ML PO ELIX: 12.5000 mg | ORAL_SOLUTION | ORAL | Status: DC | PRN

## 2019-04-23 MED ORDER — FLEET ENEMA 7-19 GM/118ML RE ENEM: 1.0000 | ENEMA | Freq: Once | RECTAL | Status: DC | PRN

## 2019-04-23 MED ORDER — OXYCODONE HCL 5 MG PO TABS: 5.0000 mg | ORAL_TABLET | ORAL | 0 refills | Status: DC | PRN

## 2019-04-23 MED ORDER — PROCHLORPERAZINE MALEATE 5 MG PO TABS: 5.0000 mg | ORAL_TABLET | Freq: Four times a day (QID) | ORAL | Status: DC | PRN

## 2019-04-23 MED ORDER — POLYETHYLENE GLYCOL 3350 17 G PO PACK
17.0000 g | PACK | Freq: Every day | ORAL | Status: DC | PRN
  Administered 2019-04-25 – 2019-04-27 (×2): 17 g via ORAL
  Filled 2019-04-23 (×2): qty 1

## 2019-04-23 MED ORDER — PROCHLORPERAZINE EDISYLATE 10 MG/2ML IJ SOLN: 5.0000 mg | Freq: Four times a day (QID) | INTRAMUSCULAR | Status: DC | PRN

## 2019-04-23 MED ORDER — DIPHENHYDRAMINE HCL 12.5 MG/5ML PO ELIX: 12.5000 mg | ORAL_SOLUTION | Freq: Four times a day (QID) | ORAL | Status: DC | PRN

## 2019-04-23 MED ORDER — ACETAMINOPHEN 325 MG PO TABS
325.0000 mg | ORAL_TABLET | ORAL | Status: DC | PRN
  Administered 2019-04-23 – 2019-04-25 (×5): 650 mg via ORAL
  Filled 2019-04-23 (×6): qty 2

## 2019-04-23 MED ORDER — GABAPENTIN 100 MG PO CAPS
100.0000 mg | ORAL_CAPSULE | Freq: Three times a day (TID) | ORAL | Status: DC
  Administered 2019-04-23 – 2019-05-01 (×23): 100 mg via ORAL
  Filled 2019-04-23 (×23): qty 1

## 2019-04-23 MED ORDER — POLYSACCHARIDE IRON COMPLEX 150 MG PO CAPS
150.0000 mg | ORAL_CAPSULE | Freq: Two times a day (BID) | ORAL | Status: DC
  Administered 2019-04-23 – 2019-05-01 (×16): 150 mg via ORAL
  Filled 2019-04-23 (×16): qty 1

## 2019-04-23 MED ORDER — METHOCARBAMOL 500 MG PO TABS
500.0000 mg | ORAL_TABLET | Freq: Four times a day (QID) | ORAL | Status: DC | PRN
  Administered 2019-04-24 – 2019-04-29 (×9): 500 mg via ORAL
  Filled 2019-04-23 (×9): qty 1

## 2019-04-23 MED ORDER — OXYCODONE HCL 5 MG PO TABS
10.0000 mg | ORAL_TABLET | ORAL | Status: DC | PRN
  Administered 2019-04-23 – 2019-04-24 (×6): 10 mg via ORAL
  Administered 2019-04-25: 15 mg via ORAL
  Administered 2019-04-25 (×2): 10 mg via ORAL
  Administered 2019-04-25 – 2019-04-26 (×5): 15 mg via ORAL
  Administered 2019-04-27: 10 mg via ORAL
  Administered 2019-04-27 (×2): 15 mg via ORAL
  Administered 2019-04-27 – 2019-04-28 (×3): 10 mg via ORAL
  Administered 2019-04-28: 15 mg via ORAL
  Administered 2019-04-28 – 2019-05-01 (×6): 10 mg via ORAL
  Filled 2019-04-23: qty 2
  Filled 2019-04-23: qty 3
  Filled 2019-04-23: qty 2
  Filled 2019-04-23: qty 3
  Filled 2019-04-23 (×3): qty 2
  Filled 2019-04-23: qty 3
  Filled 2019-04-23 (×3): qty 2
  Filled 2019-04-23: qty 3
  Filled 2019-04-23 (×3): qty 2
  Filled 2019-04-23 (×2): qty 3
  Filled 2019-04-23 (×2): qty 2
  Filled 2019-04-23: qty 3
  Filled 2019-04-23: qty 2
  Filled 2019-04-23 (×2): qty 3
  Filled 2019-04-23: qty 2
  Filled 2019-04-23: qty 3
  Filled 2019-04-23 (×2): qty 2

## 2019-04-23 MED ORDER — BISACODYL 10 MG RE SUPP: 10.0000 mg | Freq: Every day | RECTAL | Status: DC | PRN
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
## 2019-04-23 NOTE — Progress Notes (Signed)
Report given to nurse on 4W, pt going to 4W16. Pt not in distress and tolerated well.
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
## 2019-04-23 NOTE — Progress Notes (Signed)
Patient ID: Sarah Bennett, female   DOB: 1975/03/02, 44 y.o.   MRN: [id number] There is been no acute changes in the patient's medical status.  Her left leg is stable.  I am fine with her being discharged to inpatient rehab which has been recommended by therapy and they have seen her.  I am unsure as to why this transition or discharge has not happened yet.  If they are able to take her today that would be great.  They just need to reach out and let us know what I can do online.
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
## 2019-04-23 NOTE — Progress Notes (Signed)
Pt arrived on the IR unit via bed with all personal belongings. Pt was educated about policies and procedures on rehab unit. Pt is resting in bed eating dinner, reports pain of 8, Meds (MAR) given. Bed alarm on and call bell within reach. Doy Hutching, LPN

## 2019-04-23 NOTE — Telephone Encounter (Signed)
Sarah Bennett with [facility] inpatient rehab called requesting discharge orders for patient.  Patient is in 5N06  Please call back [phone]

## 2019-04-23 NOTE — Progress Notes (Addendum)
Inpatient Rehabilitation-Admissions Coordinator   I have a bed available for this patient today. Awaiting medical clearance (DC order) from Dr. Ninfa Linden. I have left a message with his office for a call back. Will follow up as soon as I get clearance to DC to CIR today from the attending service.   Raechel Ache, OTR/L  Rehab Admissions Coordinator  [phone] 04/23/2019 10:00 AM   ADDENDUM 11:28AM: Received medical clearance from Dr. Ninfa Linden for admit to CIR today. Pt and her daughter made aware of acceptance to CIR and are willing to proceed. Reviewed insurance benefit letter and consent forms with pt. All questions answered. RN and [facility] team aware of plan.   Please call if questions.   Raechel Ache, OTR/L  Rehab Admissions Coordinator  [phone] 04/23/2019 11:29 AM

## 2019-04-23 NOTE — Plan of Care (Signed)
  Problem: Pain Managment: Goal: General experience of comfort will improve Outcome: Progressing   

## 2019-04-24 ENCOUNTER — Inpatient Hospital Stay (HOSPITAL_COMMUNITY): Payer: Medicaid Other | Admitting: Occupational Therapy

## 2019-04-24 ENCOUNTER — Inpatient Hospital Stay (HOSPITAL_COMMUNITY): Payer: Medicaid Other

## 2019-04-24 ENCOUNTER — Inpatient Hospital Stay (HOSPITAL_COMMUNITY): Payer: Medicaid Other | Admitting: Physical Therapy

## 2019-04-24 DIAGNOSIS — R609 Edema, unspecified: Secondary | ICD-10-CM | POA: Diagnosis not present

## 2019-04-24 DIAGNOSIS — S82209D Unspecified fracture of shaft of unspecified tibia, subsequent encounter for closed fracture with routine healing: Secondary | ICD-10-CM | POA: Diagnosis not present

## 2019-04-24 DIAGNOSIS — S82409D Unspecified fracture of shaft of unspecified fibula, subsequent encounter for closed fracture with routine healing: Secondary | ICD-10-CM | POA: Diagnosis not present

## 2019-04-24 LAB — COMPREHENSIVE METABOLIC PANEL
ALT: 20 U/L (ref 0–44)
AST: 23 U/L (ref 15–41)
Albumin: 3 g/dL — ABNORMAL LOW (ref 3.5–5.0)
Alkaline Phosphatase: 62 U/L (ref 38–126)
Anion gap: 8 (ref 5–15)
BUN: 8 mg/dL (ref 6–20)
CO2: 26 mmol/L (ref 22–32)
Calcium: 8.8 mg/dL — ABNORMAL LOW (ref 8.9–10.3)
Chloride: 102 mmol/L (ref 98–111)
Creatinine, Ser: 0.8 mg/dL (ref 0.44–1.00)
GFR calc Af Amer: >60 mL/min (ref >60)
GFR calc non Af Amer: >60 mL/min (ref >60)
Glucose, Bld: 140 mg/dL — ABNORMAL HIGH (ref 70–99)
Potassium: 4 mmol/L (ref 3.5–5.1)
Sodium: 136 mmol/L (ref 135–145)
Total Bilirubin: 0.6 mg/dL (ref 0.3–1.2)
Total Protein: 6.8 g/dL (ref 6.5–8.1)

## 2019-04-24 LAB — CBC WITH DIFFERENTIAL/PLATELET
Abs Immature Granulocytes: 0.02 10*3/uL (ref 0.00–0.07)
Basophils Absolute: 0 10*3/uL (ref 0.0–0.1)
Basophils Relative: 1 %
Eosinophils Absolute: 0.1 10*3/uL (ref 0.0–0.5)
Eosinophils Relative: 2 %
HCT: 27.1 % — ABNORMAL LOW (ref 36.0–46.0)
Hemoglobin: 7.9 g/dL — ABNORMAL LOW (ref 12.0–15.0)
Immature Granulocytes: 0 %
Lymphocytes Relative: 17 %
Lymphs Abs: 1 10*3/uL (ref 0.7–4.0)
MCH: 22.6 pg — ABNORMAL LOW (ref 26.0–34.0)
MCHC: 29.2 g/dL — ABNORMAL LOW (ref 30.0–36.0)
MCV: 77.7 fL — ABNORMAL LOW (ref 80.0–100.0)
Monocytes Absolute: 0.5 10*3/uL (ref 0.1–1.0)
Monocytes Relative: 8 %
Neutro Abs: 4.5 10*3/uL (ref 1.7–7.7)
Neutrophils Relative %: 72 %
Platelets: 175 10*3/uL (ref 150–400)
RBC: 3.49 MIL/uL — ABNORMAL LOW (ref 3.87–5.11)
RDW: 17.9 % — ABNORMAL HIGH (ref 11.5–15.5)
WBC: 6.2 10*3/uL (ref 4.0–10.5)
nRBC: 0 % (ref 0.0–0.2)

## 2019-04-24 LAB — HEMOGLOBIN A1C
Hgb A1c MFr Bld: 5.5 % (ref 4.8–5.6)
Mean Plasma Glucose: 111.15 mg/dL

## 2019-04-24 MED ORDER — BISACODYL 10 MG RE SUPP: 10.0000 mg | Freq: Every day | RECTAL | Status: DC | PRN

## 2019-04-24 MED ORDER — SORBITOL 70 % SOLN: 30.0000 mL | Freq: Every day | Status: DC | PRN
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
## 2019-04-24 NOTE — Progress Notes (Signed)
Lower venous duplex       has been completed. Preliminary results can be found under CV proc through chart review. Mykah Shin, BS, RDMS, RVT   
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
## 2019-04-24 NOTE — Progress Notes (Signed)
PHYSICAL MEDICINE & REHABILITATION PROGRESS NOTE   Subjective/Complaints:   Pt reports feeling OK.  Bowels are working well- denies constpation.  Although has pain, said meds are working- got them last ~ 5-6am and are due soon.  Pain Meds Working well.   ROS:  Pt denies SOB, abd pain, CP, N/V/C/D, and vision changes  Objective:   No results found. Recent Labs    04/24/19 0827  WBC 6.2  HGB 7.9*  HCT 27.1*  PLT 175   Recent Labs    04/24/19 0827  NA 136  K 4.0  CL 102  CO2 26  GLUCOSE 140*  BUN 8  CREATININE 0.80  CALCIUM 8.8*    Intake/Output Summary (Last 24 hours) at 04/24/2019 1453 Last data filed at 04/24/2019 1300 Gross per 24 hour  Intake 480 ml  Output --  Net 480 ml     Physical Exam: Vital Signs Blood pressure 124/71, pulse 80, temperature 99.5 F (37.5 C), temperature source Oral, resp. rate 16, height 5\' 5"  (1.651 m), weight 110.3 kg, last menstrual period 04/15/2019, SpO2 100 %.    Physical Exam  Nursing note and vitals reviewed. Constitutional: pt is laying in bed; wearing headscarf, appropriate, supine, NAD HENT: EOMI B/L; conjugate gaze CV: RRR  Respiratory: CTA B/L- good air movement GI: Soft, NT, ND, (+)BS   Musculoskeletal:     Comments: Left lower extremity edema   Neurological: Ox3.  Speech clear and able to follow commands without difficulty.  Motor: Right upper extremity: 5/5 proximal distal Left upper extremity: 4+/5 proximal distal, except for wrist, which is braced Right lower extremity: 5/5 proximal distal Left lower extremity: 2/5 proximal distal (some pain inhibition) Sensation intact light touch  Skin:  Left lower extremity with dressing C/D/I  Psychiatric: appropriate, sweet, smiling      Assessment/Plan: 1. Functional deficits secondary to [place] which require 3+ hours per day of interdisciplinary therapy in a comprehensive inpatient rehab setting.  Physiatrist is providing  close team supervision and 24 hour management of active medical problems listed below.  Physiatrist and rehab team continue to assess barriers to discharge/monitor patient progress toward functional and medical goals  Care Tool:  Bathing    Body parts bathed by patient: Right arm, Left arm, Chest, Abdomen, Front perineal area, Right upper leg, Left upper leg, Face   Body parts bathed by helper: Buttocks, Right lower leg, Left lower leg     Bathing assist Assist Level: Minimal Assistance - Patient > 75%     Upper Body Dressing/Undressing Upper body dressing   What is the patient wearing?: Dress    Upper body assist Assist Level: Minimal Assistance - Patient > 75%    Lower Body Dressing/Undressing Lower body dressing    Lower body dressing activity did not occur: Refused What is the patient wearing?: Hospital gown only     Lower body assist Assist for lower body dressing: (declines)     Toileting Toileting    Toileting assist Assist for toileting: Minimal Assistance - Patient > 75%     Transfers Chair/bed transfer  Transfers assist           Locomotion Ambulation   Ambulation assist              Walk 10 feet activity   Assist           Walk 50 feet activity   Assist           Walk 150 feet  activity   Assist           Walk 10 feet on uneven surface  activity   Assist           Wheelchair     Assist               Wheelchair 50 feet with 2 turns activity    Assist            Wheelchair 150 feet activity     Assist          Blood pressure 124/71, pulse 80, temperature 99.5 F (37.5 C), temperature source Oral, resp. rate 16, height 5\' 5"  (1.651 m), weight 110.3 kg, last menstrual period 04/15/2019, SpO2 100 %.  Medical Problem List and Plan: 1.  Deficits with mobility, transfers, self-care secondary to multi-- Ortho.             -patient may not shower             -ELOS/Goals: 10-14  days/supervision/mod I.             Admit to CIR 2.  Antithrombotics: -DVT/anticoagulation:  Pharmaceutical: Lovenox             -antiplatelet therapy: N/A 3. Pain Management:  Continue gabapentin tid with Oxycodone and robaxin prn  4/17- pt reports pain well controlled with meds- con't regimen.              Monitor with increased exertion 4. Mood: LCSW to follow for evaluation and support.              -antipsychotic agents: N/A 5. Neuropsych: This patient is capable of making decisions on her own behalf. 6. Skin/Wound Care: Monitor wound for healing. Add  Protein supplement to promote healing.  7. Fluids/Electrolytes/Nutrition: Monitor I/O.  CMP ordered.. 8. ABLA: CBC ordered. 10. Hyperglycemia: Likely stress induced.              Hemoglobin A1c ordered             Moderate increased mobility 11.  Morbid obesity: Encouraged weight loss 12. Constipation  4/17-LBM 3+ days ago- that's documented- will order sorbitol prn and suppository prn.      LOS: 1 days A FACE TO FACE EVALUATION WAS PERFORMED  Sarah Bennett 04/24/2019, 2:53 PM

## 2019-04-25 ENCOUNTER — Inpatient Hospital Stay (HOSPITAL_COMMUNITY): Payer: Medicaid Other | Admitting: Occupational Therapy

## 2019-04-25 DIAGNOSIS — S82409D Unspecified fracture of shaft of unspecified fibula, subsequent encounter for closed fracture with routine healing: Secondary | ICD-10-CM | POA: Diagnosis not present

## 2019-04-25 DIAGNOSIS — S82209D Unspecified fracture of shaft of unspecified tibia, subsequent encounter for closed fracture with routine healing: Secondary | ICD-10-CM | POA: Diagnosis not present

## 2019-04-25 MED ORDER — MAGNESIUM CITRATE PO SOLN
1.0000 | Freq: Once | ORAL | Status: DC
  Filled 2019-04-25 (×3): qty 296

## 2019-04-25 NOTE — Progress Notes (Signed)
[facility] PHYSICAL MEDICINE & REHABILITATION PROGRESS NOTE   Subjective/Complaints:   Pt reports doing OK- just washed up with help of nurse at sink- rates pain at 2-3/10 after meds.   Slept OK and had visitor yesterday.   ROS:  Pt denies SOB, abd pain, CP, N/V/C/D, and vision changes   Objective:   VAS [place] LOWER EXTREMITY VENOUS (DVT)  Result Date: 04/24/2019  Lower Venous DVTStudy Indications: Tibia/ fibula fracture.  Performing Technologist: June Leap RDMS, RVT  Examination Guidelines: A complete evaluation includes B-mode imaging, spectral Doppler, color Doppler, and power Doppler as needed of all accessible portions of each vessel. Bilateral testing is considered an integral part of a complete examination. Limited examinations for reoccurring indications may be performed as noted. The reflux portion of the exam is performed with the patient in reverse Trendelenburg.  +---------+---------------+---------+-----------+----------+--------------+ RIGHT    CompressibilityPhasicitySpontaneityPropertiesThrombus Aging +---------+---------------+---------+-----------+----------+--------------+ CFV      Full           Yes      Yes                                 +---------+---------------+---------+-----------+----------+--------------+ SFJ      Full                                                        +---------+---------------+---------+-----------+----------+--------------+ FV Prox  Full                                                        +---------+---------------+---------+-----------+----------+--------------+ FV Mid   Full                                                        +---------+---------------+---------+-----------+----------+--------------+ FV DistalFull                                                        +---------+---------------+---------+-----------+----------+--------------+ PFV      Full                                                         +---------+---------------+---------+-----------+----------+--------------+ POP      Full           Yes      Yes                                 +---------+---------------+---------+-----------+----------+--------------+ PTV      Full                                                        +---------+---------------+---------+-----------+----------+--------------+  PERO     Full                                                        +---------+---------------+---------+-----------+----------+--------------+   +---------+---------------+---------+-----------+----------+--------------+ LEFT     CompressibilityPhasicitySpontaneityPropertiesThrombus Aging +---------+---------------+---------+-----------+----------+--------------+ CFV      Full           Yes      Yes                                 +---------+---------------+---------+-----------+----------+--------------+ SFJ      Full                                                        +---------+---------------+---------+-----------+----------+--------------+ FV Prox  Full                                                        +---------+---------------+---------+-----------+----------+--------------+ FV Mid   Full                                                        +---------+---------------+---------+-----------+----------+--------------+ FV DistalFull                                                        +---------+---------------+---------+-----------+----------+--------------+ PFV      Full                                                        +---------+---------------+---------+-----------+----------+--------------+ POP      Full           Yes      Yes                                 +---------+---------------+---------+-----------+----------+--------------+ PTV      Full                                                         +---------+---------------+---------+-----------+----------+--------------+ PERO     Full                                                        +---------+---------------+---------+-----------+----------+--------------+  Not all segments well visualized as patient could not tolerate compressions well, due to pain    Summary: RIGHT: - There is no evidence of deep vein thrombosis in the lower extremity.  - No cystic structure found in the popliteal fossa.  LEFT: - There is no evidence of deep vein thrombosis in the lower extremity.  - No cystic structure found in the popliteal fossa.  *See table(s) above for measurements and observations. Electronically signed by Monica Martinez MD on 04/24/2019 at 3:16:12 PM.    Final    Recent Labs    04/24/19 0827  WBC 6.2  HGB 7.9*  HCT 27.1*  PLT 175   Recent Labs    04/24/19 0827  NA 136  K 4.0  CL 102  CO2 26  GLUCOSE 140*  BUN 8  CREATININE 0.80  CALCIUM 8.8*    Intake/Output Summary (Last 24 hours) at 04/25/2019 1403 Last data filed at 04/24/2019 1855 Gross per 24 hour  Intake 240 ml  Output --  Net 240 ml     Physical Exam: Vital Signs Blood pressure 127/72, pulse 86, temperature 99.2 F (37.3 C), temperature source Oral, resp. rate 18, height 5\' 5"  (1.651 m), weight 110.3 kg, last menstrual period 04/15/2019, SpO2 100 %.    Physical Exam  Nursing note and vitals reviewed. Constitutional: pt laying supine in bed; appropriate, pretty headscarf in place, NAD HENT: EOMI B/L; conjugate gaze CV: RRR- no JVD  Respiratory: CTA B/L- no W/R/R- good air movement GI: Soft, NT, ND, (+)BS  Musculoskeletal:     Comments: LLE edema 3+ to midcalf   Neurological: Ox3  Following 2 step commands- no issues  Motor: Right upper extremity: 5/5 proximal distal Left upper extremity: 4+/5 proximal distal, except for wrist, which is braced Right lower extremity: 5/5 proximal distal Left lower extremity: 2/5 proximal distal (some pain  inhibition) Sensation intact light touch  Skin:  Left lower extremity with dressing C/D/I  Psychiatric: appropriate     Assessment/Plan: 1. Functional deficits secondary to [place] which require 3+ hours per day of interdisciplinary therapy in a comprehensive inpatient rehab setting.  Physiatrist is providing close team supervision and 24 hour management of active medical problems listed below.  Physiatrist and rehab team continue to assess barriers to discharge/monitor patient progress toward functional and medical goals  Care Tool:  Bathing    Body parts bathed by patient: Right arm, Left arm, Chest, Abdomen, Front perineal area, Right upper leg, Left upper leg, Face   Body parts bathed by helper: Buttocks, Right lower leg, Left lower leg     Bathing assist Assist Level: Minimal Assistance - Patient > 75%     Upper Body Dressing/Undressing Upper body dressing   What is the patient wearing?: Dress    Upper body assist Assist Level: Minimal Assistance - Patient > 75%    Lower Body Dressing/Undressing Lower body dressing    Lower body dressing activity did not occur: Refused What is the patient wearing?: Hospital gown only     Lower body assist Assist for lower body dressing: (declines)     Toileting Toileting    Toileting assist Assist for toileting: Supervision/Verbal cueing     Transfers Chair/bed transfer  Transfers assist     Chair/bed transfer assist level: Minimal Assistance - Patient > 75%     Locomotion Ambulation   Ambulation assist      Assist level: Minimal Assistance - Patient > 75% Assistive device: Walker-rolling Max distance: 20  Walk 10 feet activity   Assist     Assist level: Minimal Assistance - Patient > 75% Assistive device: Walker-rolling   Walk 50 feet activity   Assist Walk 50 feet with 2 turns activity did not occur: Safety/medical concerns         Walk 150 feet activity   Assist  Walk 150 feet activity did not occur: Safety/medical concerns         Walk 10 feet on uneven surface  activity   Assist Walk 10 feet on uneven surfaces activity did not occur: Safety/medical concerns         Wheelchair     Assist Will patient use wheelchair at discharge?: Yes      Wheelchair assist level: Minimal Assistance - Patient > 75% Max wheelchair distance: 100    Wheelchair 50 feet with 2 turns activity    Assist        Assist Level: Minimal Assistance - Patient > 75%   Wheelchair 150 feet activity     Assist      Assist Level: Moderate Assistance - Patient 50 - 74%   Blood pressure 127/72, pulse 86, temperature 99.2 F (37.3 C), temperature source Oral, resp. rate 18, height 5\' 5"  (1.651 m), weight 110.3 kg, last menstrual period 04/15/2019, SpO2 100 %.  Medical Problem List and Plan: 1.  Deficits with mobility, transfers, self-care secondary to multi-- Ortho.             -patient may not shower             -ELOS/Goals: 10-14 days/supervision/mod I.             Admit to CIR 2.  Antithrombotics: -DVT/anticoagulation:  Pharmaceutical: Lovenox             -antiplatelet therapy: N/A 3. Pain Management:  Continue gabapentin tid with Oxycodone and robaxin prn  4/17- pt reports pain well controlled with meds- con't regimen.              Monitor with increased exertion 4. Mood: LCSW to follow for evaluation and support.              -antipsychotic agents: N/A 5. Neuropsych: This patient is capable of making decisions on her own behalf. 6. Skin/Wound Care: Monitor wound for healing. Add  Protein supplement to promote healing.  7. Fluids/Electrolytes/Nutrition: Monitor I/O.  CMP ordered.. 8. ABLA: CBC ordered. 10. Hyperglycemia: Likely stress induced.              Hemoglobin A1c ordered             Moderate increased mobility 11.  Morbid obesity: Encouraged weight loss 12. Constipation  4/17-LBM 3+ days ago- that's documented- will order  sorbitol prn and suppository prn.     4/18- no BM documented back as far as 4/12- so 6 days?? Will order mg citrate- just need her to have BM.   LOS: 2 days A FACE TO FACE EVALUATION WAS PERFORMED  Tattianna Schnarr 04/25/2019, 2:03 PM

## 2019-04-25 NOTE — Progress Notes (Signed)
Occupational Therapy Session Note  Patient Details  Name: Sarah Bennett MRN: [id number] Date of Birth: 1975-02-08  Today's Date: 04/25/2019 OT Individual Time: [id number] OT Individual Time Calculation (min): 58 min   Short Term Goals: Week 1:  OT Short Term Goal 1 (Week 1): Pt will transfer to [facility] with S OT Short Term Goal 2 (Week 1): Pt will complete TTB transfer with A to manage LLE over tub ledge only OT Short Term Goal 3 (Week 1): Pt will don B socks with Vc for device use  Skilled Therapeutic Interventions/Progress Updates:    Pt greeted in bed, premedicated for pain. She was requesting to use the bathroom. Pt first donned footwear in bed with assist for the Lt gripper sock. Min A for guiding Lt LE off of bed while transitioning to EOB. Min A for ambulatory transfer to toilet using RW, pt keeping her Lt foot off of floor for precaution adherence. She completed perihygiene in sitting and standing positions with CGA for standing balance. Handwashing completed while seated at the sink afterwards. Pt then engaged in UB bathing and several other grooming tasks with setup assistance. She declined dressing today. We discussed her bathroom setup at home with pt verbalizing that her bathroom is RW accessible. She has a tub shower. Took pt via w/c down to the tub shower room and OT demonstrated TTB transfer technique using RW. We discussed having nonslip treads for the bottom of the shower and also getting dressed sit<stand from elevated toilet with toilet bars as she also wants a BSC for home use. We discussed placing BSC over toilet in the daytime and having BSC in bedroom for nighttime toileting. Pt agreeable. OT also provided her with a reacher bag and walker bag, discussed functional implications for ADL item transport during BADL routine at home. Pt appreciative. Brought her back to room via w/c and she completed a short distance ambulatory transfer with RW and Min A. She returned to bed with Min A  for the Lt LE. Left her with all needs within reach and bed alarm set.    Therapy Documentation Precautions:  Precautions Precautions: Fall Precaution Comments: TDWB only left LE; can put weight thru left wrist Required Braces or Orthoses: Other Brace Other Brace: L velcro wrist brace for comfort Restrictions Weight Bearing Restrictions: Yes LUE Weight Bearing: Weight bearing as tolerated LLE Weight Bearing: Touchdown weight bearing Pain: Pain Assessment Pain Scale: 0-10 Pain Score: 4  Pain Type: Acute pain Pain Location: Leg Pain Orientation: Left Pain Descriptors / Indicators: Aching Pain Onset: Gradual Pain Intervention(s): Medication (See eMAR) ADL: ADL Eating: Independent Grooming: Setup Where Assessed-Grooming: (shower) Upper Body Bathing: Setup Where Assessed-Upper Body Bathing: Shower Lower Body Bathing: Maximal assistance Where Assessed-Lower Body Bathing: Shower Upper Body Dressing: Minimal assistance Where Assessed-Upper Body Dressing: Edge of bed Lower Body Dressing: Maximal assistance(clinical judgement) Where Assessed-Lower Body Dressing: Edge of bed Toileting: Minimal assistance Where Assessed-Toileting: Bedside Commode Toilet Transfer: Minimal assistance Toilet Transfer Method: Counselling psychologist: Geophysical data processor: Minimal assistance Social research officer, government Method: Heritage manager: Shower seat with back, Grab bars      Therapy/Group: Individual Therapy  Paddy Walthall A Lorey Pallett 04/25/2019, 4:43 PM

## 2019-04-26 ENCOUNTER — Inpatient Hospital Stay (HOSPITAL_COMMUNITY): Payer: Medicaid Other | Admitting: Occupational Therapy

## 2019-04-26 ENCOUNTER — Inpatient Hospital Stay (HOSPITAL_COMMUNITY): Payer: Medicaid Other

## 2019-04-26 ENCOUNTER — Inpatient Hospital Stay (HOSPITAL_COMMUNITY): Payer: Medicaid Other | Admitting: Physical Therapy

## 2019-04-26 DIAGNOSIS — S82402S Unspecified fracture of shaft of left fibula, sequela: Secondary | ICD-10-CM | POA: Diagnosis not present

## 2019-04-26 DIAGNOSIS — K5901 Slow transit constipation: Secondary | ICD-10-CM | POA: Diagnosis not present

## 2019-04-26 DIAGNOSIS — S82202S Unspecified fracture of shaft of left tibia, sequela: Secondary | ICD-10-CM

## 2019-04-26 DIAGNOSIS — D62 Acute posthemorrhagic anemia: Secondary | ICD-10-CM

## 2019-04-26 LAB — BASIC METABOLIC PANEL
Anion gap: 10 (ref 5–15)
BUN: 9 mg/dL (ref 6–20)
CO2: 25 mmol/L (ref 22–32)
Calcium: 8.8 mg/dL — ABNORMAL LOW (ref 8.9–10.3)
Chloride: 100 mmol/L (ref 98–111)
Creatinine, Ser: 0.8 mg/dL (ref 0.44–1.00)
GFR calc Af Amer: >60 mL/min (ref >60)
GFR calc non Af Amer: >60 mL/min (ref >60)
Glucose, Bld: 96 mg/dL (ref 70–99)
Potassium: 4 mmol/L (ref 3.5–5.1)
Sodium: 135 mmol/L (ref 135–145)

## 2019-04-26 LAB — CBC
HCT: 26.2 % — ABNORMAL LOW (ref 36.0–46.0)
Hemoglobin: 7.8 g/dL — ABNORMAL LOW (ref 12.0–15.0)
MCH: 23.1 pg — ABNORMAL LOW (ref 26.0–34.0)
MCHC: 29.8 g/dL — ABNORMAL LOW (ref 30.0–36.0)
MCV: 77.7 fL — ABNORMAL LOW (ref 80.0–100.0)
Platelets: 182 10*3/uL (ref 150–400)
RBC: 3.37 MIL/uL — ABNORMAL LOW (ref 3.87–5.11)
RDW: 17.6 % — ABNORMAL HIGH (ref 11.5–15.5)
WBC: 5.9 10*3/uL (ref 4.0–10.5)
nRBC: 0 % (ref 0.0–0.2)

## 2019-04-26 MED ORDER — SENNOSIDES-DOCUSATE SODIUM 8.6-50 MG PO TABS
2.0000 | ORAL_TABLET | Freq: Every day | ORAL | Status: DC
  Administered 2019-04-26: 2 via ORAL
  Filled 2019-04-26: qty 2

## 2019-04-26 NOTE — IPOC Note (Signed)
Overall Plan of Care [facility]) Patient Details Name: Sarah Bennett MRN: [id number] DOB: 12-02-1975  Admitting Diagnosis: Tibia/fibula fracture  Hospital Problems: Principal Problem:   Tibia/fibula fracture     Functional Problem List: Nursing Behavior, Endurance, Pain, Safety, Skin Integrity  PT Balance, Edema, Endurance, Pain  OT Balance, Edema, Endurance, Pain, Safety, Skin Integrity  SLP    TR         Basic ADL's: OT Grooming, Bathing, Dressing, Toileting     Advanced  ADL's: OT Simple Meal Preparation, Laundry     Transfers: PT Bed Mobility, Bed to Chair, Car, Manufacturing systems engineer, Metallurgist: PT Ambulation, Data processing manager, Emergency planning/management officer     Additional Impairments: OT    SLP        TR      Anticipated Outcomes Item Anticipated Outcome  Self Feeding MOD I  Swallowing      Basic self-care  MODI  Toileting  MOD I   Bathroom Transfers MOD I toileting; S Shower  Bowel/Bladder  manage bowel and bladder with mod I assit  Transfers  Supervision assist with LRAD  Locomotion  Supervision assist WC mobility. supervision assist gait for household distances.  Communication     Cognition     Pain  pain level less than 4 on scale of 0-10  Safety/Judgment  remain free of injury, prevent falls with mod I assit   Therapy Plan: PT Intensity: Minimum of 1-2 x/day ,45 to 90 minutes PT Frequency: 5 out of 7 days PT Duration Estimated Length of Stay: 10-12 days OT Intensity: Minimum of 1-2 x/day, 45 to 90 minutes OT Frequency: 5 out of 7 days OT Duration/Estimated Length of Stay: 10-12     Due to the current state of emergency, patients may not be receiving their 3-hours of Medicare-mandated therapy.   Team Interventions: Nursing Interventions Patient/Family Education, Pain Management, Bowel Management, Bladder Management, Medication Management, Psychosocial Support, Disease Management/Prevention, Discharge Planning  PT interventions  Ambulation/gait training, Discharge planning, Functional mobility training, Psychosocial support, Therapeutic Activities, Visual/perceptual remediation/compensation, Wheelchair propulsion/positioning, Therapeutic Exercise, Skin care/wound management, Neuromuscular re-education, Balance/vestibular training, Disease management/prevention, Cognitive remediation/compensation, DME/adaptive equipment instruction, Splinting/orthotics, Pain management, UE/LE Strength taining/ROM, UE/LE Coordination activities, Stair training, Patient/family education, Community reintegration  OT Interventions Training and development officer, Discharge planning, Pain management, Self Care/advanced ADL retraining, Therapeutic Activities, UE/LE Coordination activities, Therapeutic Exercise, Skin care/wound managment, Patient/family education, Functional mobility training, Disease mangement/prevention, Academic librarian, Engineer, drilling, Neuromuscular re-education, Psychosocial support, UE/LE Strength taining/ROM, Wheelchair propulsion/positioning  SLP Interventions    TR Interventions    SW/CM Interventions Discharge Planning, Psychosocial Support, Patient/Family Education   Barriers to Discharge MD  Medical stability  Nursing      PT Inaccessible home environment, Decreased caregiver support, Wound Care, Weight bearing restrictions    OT Decreased caregiver support, Inaccessible home environment stairs up to apartment  SLP      SW       Team Discharge Planning: Destination: PT-  ,OT- Home , SLP-  Projected Follow-up: PT-Home health PT, OT-  Home health OT, SLP-  Projected Equipment Needs: PT-Rolling walker with 5" wheels, Wheelchair (measurements), Wheelchair cushion (measurements), To be determined, OT- 3 in 1 bedside comode, Tub/shower bench, SLP-  Equipment Details: PT- , OT-  Patient/family involved in discharge planning: PT- Patient,  OT-Patient, SLP-   MD ELOS: 10-12 days Medical Rehab  Prognosis:  Excellent Assessment: The patient has been admitted for CIR therapies with the diagnosis of left tib-fib fx with  polytrauma. The team will be addressing functional mobility, strength, stamina, balance, safety, adaptive techniques and equipment, self-care, bowel and bladder mgt, patient and caregiver education, pain mgt, wb/ortho precautions, community reentry. Goals have been set at mod I for self-care and mod I/supervision for mobility.   Due to the current state of emergency, patients may not be receiving their 3 hours per day of Medicare-mandated therapy.    Meredith Staggers, MD, FAAPMR      See Team Conference Notes for weekly updates to the plan of care

## 2019-04-26 NOTE — Progress Notes (Signed)
Inpatient Rehabilitation  Patient information reviewed and entered into eRehab system by Jessup Ogas M. Kriss Perleberg, M.A., CCC/SLP, PPS Coordinator.  Information including medical coding, functional ability and quality indicators will be reviewed and updated through discharge.    

## 2019-04-26 NOTE — Care Management (Signed)
Inpatient Rehabilitation Center Individual Statement of Services  Patient Name:  [name]  Date:  04/26/2019  Welcome to the [facility].  Our goal is to provide you with an individualized program based on your diagnosis and situation, designed to meet your specific needs.  With this comprehensive rehabilitation program, you will be expected to participate in at least 3 hours of rehabilitation therapies Monday-Friday, with modified therapy programming on the weekends.  Your rehabilitation program will include the following services:  Physical Therapy (PT), Occupational Therapy (OT), 24 hour per day rehabilitation nursing, Therapeutic Recreaction (TR), Psychology, Neuropsychology, Case Management (Social Worker), Rehabilitation Medicine, Nutrition Services, Pharmacy Services and Other  Weekly team conferences will be held on Tuesdays to discuss your progress.  Your Social Worker will talk with you frequently to get your input and to update you on team discussions.  Team conferences with you and your family in attendance may also be held.  Expected length of stay: 10-12 days   Overall anticipated outcome: Supervision  Depending on your progress and recovery, your program may change. Your Social Worker will coordinate services and will keep you informed of any changes. Your Social Worker's name and contact numbers are listed  below.  The following services may also be recommended but are not provided by the [facility] will be made to provide these services after discharge if needed.  Arrangements include referral to agencies that provide these services.  Your insurance has been verified to be:  Medicaid [place]  Your primary doctor is:  No PCP  Pertinent information will be shared with your doctor and your  insurance company.  Social Worker:  Loralee Pacas, [place] or ([phone]  Information discussed with and copy given to patient by: Rana Snare, 04/26/2019, 10:17 AM
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
## 2019-04-26 NOTE — Progress Notes (Signed)
Physical Therapy Session Note  Patient Details  Name: Sarah Bennett MRN: [id number] Date of Birth: 08/14/1975  Today's Date: 04/26/2019 PT Individual Time: 1430-1540 PT Individual Time Calculation (min): 70 min    Short Term Goals: Week 1:  PT Short Term Goal 1 (Week 1): Pt will ambulate 26ft with min assist PT Short Term Goal 2 (Week 1): Pt will propell WC 127ft with min assist PT Short Term Goal 3 (Week 1): Pt wil perform car transfer with mod assist PT Short Term Goal 4 (Week 1): Pt will initiate stair management training  Skilled Therapeutic Interventions/Progress Updates:    Pt supine in bed upon PT arrival, agreeable to therapy tx and reports pain 5/10 in L LE. Pt transferred to sitting EOB this session with min assist and L LE management. Pt performed stand pivot to w/c with RW and CGA. Pt requesting to brush teeth/wash up at sink. Propelled w/c to the sink - pt brushed teeth, washed upper body/face and donned deodorant with set up assist. Pt propelled w/c to the gym x 150 ft with supervision and increased time to complete using B UE s. Pt ambulated x 5 ft to the mat with RW and CGA, cues for techniques - pt able to maintain TDWB precautions. Pt seated edge of mat performed 2 x 10 heel slides, and x 8  L LE SAQ working on strength and ROM. Pt worked on independence with bed mobility using a leg lifter to help lift her leg onto mat - transferred to supine with supervision. In supine pt able to perform 2 x 10 heel slides L LE (limited ROM due to weakness, tightness and pain), x 10 active assisted L hip abduction/adduction, L ankle DF AAROM and stretching (limited by pain), and knee flexion stretch. Pt transferred to sitting EOB this session with use of leg lifter to bring L LE off mat with supervision and increased time. Stand pivot to w/c with RW and CGA. Seated knee flexion stretch performed passively by therapist. Pt propelled w/c back to room. Ambulated from w/c>toilet x 10 ft with RW and  CGA, continent of bladder and performing pericare without assist, ambulated to bed x 15 ft with RW and CGA. Pt transferred to supine with supervision and left with needs in reach and bed alarm set.   Therapy Documentation Precautions:  Precautions Precautions: Fall Precaution Comments: TDWB only left LE; can put weight thru left wrist Required Braces or Orthoses: Other Brace Other Brace: L velcro wrist brace for comfort Restrictions Weight Bearing Restrictions: Yes LUE Weight Bearing: Weight bearing as tolerated LLE Weight Bearing: Touchdown weight bearing   Therapy/Group: Individual Therapy  Netta Corrigan, PT, DPT, CSRS 04/26/2019, 7:46 AM

## 2019-04-26 NOTE — Progress Notes (Signed)
Physical Therapy Session Note  Patient Details  Name: Sarah Bennett MRN: [id number] Date of Birth: [birth year]  Today's Date: 04/26/2019 PT Individual Time: 0730-0825 PT Individual Time Calculation (min): 55 min   Short Term Goals: Week 1:  PT Short Term Goal 1 (Week 1): Pt will ambulate 31ft with min assist PT Short Term Goal 2 (Week 1): Pt will propell WC 117ft with min assist PT Short Term Goal 3 (Week 1): Pt wil perform car transfer with mod assist PT Short Term Goal 4 (Week 1): Pt will initiate stair management training  Skilled Therapeutic Interventions/Progress Updates:   Pt received in supine, agreeable to therapy and reports pain in LLE but does not rate. Pain resolves when LLE is at rest. Min assist bed mobility and CGA stand pivot to w/c, pt maintaining LLE off ground well. Set-up assist to brush teeth and perform upper body bathing/dressing at sink from seated level. Ambulated 10' w/ CGA using RW to/from toilet, CGA toilet transfer and pt continent of void. Pt self-propelled w/c ~25' towards therapy gym w/ BUEs and max verbal and visual cues for technique. Pt w/ difficulty coordinating turns. Total assist remainder of way. Demonstrated using shower chair to negotiate 1 step at a time w/ L rail. Pt performed w/ CGA sit<>stands from shower chair using rail, therapist providing total assist to move/place shower chair x3 steps up/down. Pt pleased she found a way to safely access 2nd floor apartment, will continue to practice. Pt self-propelled w/c 75' on straight path back towards room w/ only min cues for technique using BUEs, however no did not perform turns. Total assist remainder of way. Ended session in w/c, and in care of NT. Ice applied to L anterior knee for pain and swelling relief.   Therapy Documentation Precautions:  Precautions Precautions: Fall Precaution Comments: TDWB only left LE; can put weight thru left wrist Required Braces or Orthoses: Other Brace Other Brace: L  velcro wrist brace for comfort Restrictions Weight Bearing Restrictions: Yes LUE Weight Bearing: Weight bearing as tolerated LLE Weight Bearing: Touchdown weight bearing Vital Signs:   Pain: Pain Assessment Pain Scale: 0-10 Pain Score: Asleep Pain Type: Acute pain Pain Location: Leg Pain Orientation: Left Pain Descriptors / Indicators: Aching Pain Onset: On-going Patients Stated Pain Goal: 2 Pain Intervention(s): Medication (See eMAR)  Therapy/Group: Individual Therapy  Fletcher Ostermiller Clent Demark 04/26/2019, 8:27 AM

## 2019-04-26 NOTE — Plan of Care (Signed)
  Problem: RH Bed Mobility Goal: LTG Patient will perform bed mobility with assist (PT) Description: LTG: Patient will perform bed mobility with assistance, with/without cues (PT). Flowsheets (Taken 04/26/2019 0748) LTG: Pt will perform bed mobility with assistance level of: (goal upgraded as pt will only have PRN assist at d/c - Lynnox Girten T 4/19) Independent with assistive device  Note: goal upgraded as pt will only have PRN assist at d/c - Elenora Hawbaker T 4/19   Problem: RH Bed to Chair Transfers Goal: LTG Patient will perform bed/chair transfers w/assist (PT) Description: LTG: Patient will perform bed to chair transfers with assistance (PT). Flowsheets (Taken 04/26/2019 0748) LTG: Pt will perform Bed to Chair Transfers with assistance level: (goal upgraded as pt will only have PRN assist at d/c - Darren Caldron T 4/19) Independent with assistive device  Note: goal upgraded as pt will only have PRN assist at d/c - Isais Klipfel T 4/19   Problem: RH Wheelchair Mobility Goal: LTG Patient will propel w/c in controlled environment (PT) Description: LTG: Patient will propel wheelchair in controlled environment, # of feet with assist (PT) Flowsheets (Taken 04/26/2019 0748) LTG: Pt will propel w/c in controlled environ  assist needed:: (goal upgraded as pt will only have PRN assist at d/c - Ibtisam Benge T 4/19) Independent with assistive device Note: goal upgraded as pt will only have PRN assist at d/c - Aarian Griffie T 4/19 Goal: LTG Patient will propel w/c in home environment (PT) Description: LTG: Patient will propel wheelchair in home environment, # of feet with assistance (PT). Flowsheets (Taken 04/26/2019 0748) LTG: Pt will propel w/c in home environ  assist needed:: (goal upgraded as pt will only have PRN assist at d/c - Beola Vasallo T 4/19) Independent with assistive device Note: goal upgraded as pt will only have PRN assist at d/c - Kaden Daughdrill T 4/19

## 2019-04-27 ENCOUNTER — Inpatient Hospital Stay (HOSPITAL_COMMUNITY): Payer: Medicaid Other | Admitting: Physical Therapy

## 2019-04-27 ENCOUNTER — Inpatient Hospital Stay (HOSPITAL_COMMUNITY): Payer: Medicaid Other | Admitting: Occupational Therapy

## 2019-04-27 DIAGNOSIS — S82201D Unspecified fracture of shaft of right tibia, subsequent encounter for closed fracture with routine healing: Secondary | ICD-10-CM

## 2019-04-27 DIAGNOSIS — F329 Major depressive disorder, single episode, unspecified: Secondary | ICD-10-CM

## 2019-04-27 DIAGNOSIS — K5901 Slow transit constipation: Secondary | ICD-10-CM | POA: Diagnosis not present

## 2019-04-27 DIAGNOSIS — D62 Acute posthemorrhagic anemia: Secondary | ICD-10-CM | POA: Diagnosis not present

## 2019-04-27 DIAGNOSIS — S82202S Unspecified fracture of shaft of left tibia, sequela: Secondary | ICD-10-CM | POA: Diagnosis not present

## 2019-04-27 DIAGNOSIS — S82402S Unspecified fracture of shaft of left fibula, sequela: Secondary | ICD-10-CM | POA: Diagnosis not present

## 2019-04-27 DIAGNOSIS — S82401D Unspecified fracture of shaft of right fibula, subsequent encounter for closed fracture with routine healing: Secondary | ICD-10-CM

## 2019-04-27 MED ORDER — SENNOSIDES-DOCUSATE SODIUM 8.6-50 MG PO TABS
2.0000 | ORAL_TABLET | Freq: Two times a day (BID) | ORAL | Status: DC
  Administered 2019-04-27 – 2019-05-01 (×8): 2 via ORAL
  Filled 2019-04-27 (×8): qty 2

## 2019-04-27 NOTE — Progress Notes (Signed)
[place] PHYSICAL MEDICINE & REHABILITATION PROGRESS NOTE   Subjective/Complaints:   Had a pretty good night. Pain controlled. Nurse reports some anxiety about going home, home situation. (pt did not voice that to me). Still somewhat constipated.   ROS: Patient denies fever, rash, sore throat, blurred vision, nausea, vomiting, diarrhea, cough, shortness of breath or chest pain,  headache, or mood change.    Objective:   No results found. Recent Labs    04/26/19 0644  WBC 5.9  HGB 7.8*  HCT 26.2*  PLT 182   Recent Labs    04/26/19 0644  NA 135  K 4.0  CL 100  CO2 25  GLUCOSE 96  BUN 9  CREATININE 0.80  CALCIUM 8.8*    Intake/Output Summary (Last 24 hours) at 04/27/2019 1101 Last data filed at 04/27/2019 0757 Gross per 24 hour  Intake 720 ml  Output --  Net 720 ml     Physical Exam: Vital Signs Blood pressure 138/82, pulse 97, temperature 99.5 F (37.5 C), resp. rate 19, height 5\' 5"  (1.651 m), weight 110.3 kg, last menstrual period 04/15/2019, SpO2 93 %.    Physical Exam  Constitutional: No distress . Vital signs reviewed. HEENT: EOMI, oral membranes moist Neck: supple Cardiovascular: RRR without murmur. No JVD    Respiratory/Chest: CTA Bilaterally without wheezes or rales. Normal effort    GI/Abdomen: BS +, non-tender, non-distended Ext: no clubbing, cyanosis, or edema Psych: pleasant and cooperative Musculoskeletal:     Comments: LLE edema 2+ to [place] --unchanged Neurological: Ox3, reasonable insight and awareness.  Motor: Right upper extremity: 5/5 proximal distal Left upper extremity: 4+/5 proximal distal, except for wrist which has brace Right lower extremity: 5/5 proximal distal Left lower extremity: 2/5 proximal distal --pain inhibition still an issue Sensation intact light touch  Skin:  Lacerations/incisions CDI with sutures, no abnl draiange       Assessment/Plan: 1. Functional deficits secondary to [place] which  require 3+ hours per day of interdisciplinary therapy in a comprehensive inpatient rehab setting.  Physiatrist is providing close team supervision and 24 hour management of active medical problems listed below.  Physiatrist and rehab team continue to assess barriers to discharge/monitor patient progress toward functional and medical goals  Care Tool:  Bathing    Body parts bathed by patient: Face   Body parts bathed by helper: Buttocks, Right lower leg, Left lower leg Body parts n/a: Right upper leg, Left upper leg, Right lower leg, Left lower leg   Bathing assist Assist Level: Contact Guard/Touching assist     Upper Body Dressing/Undressing Upper body dressing   What is the patient wearing?: Pull over shirt    Upper body assist Assist Level: Set up assist    Lower Body Dressing/Undressing Lower body dressing    Lower body dressing activity did not occur: Refused What is the patient wearing?: Pants     Lower body assist Assist for lower body dressing: Minimal Assistance - Patient > 75%     Toileting Toileting    Toileting assist Assist for toileting: Maximal Assistance - Patient 25 - 49%     Transfers Chair/bed transfer  Transfers assist     Chair/bed transfer assist level: Contact Guard/Touching assist     Locomotion Ambulation   Ambulation assist      Assist level: Contact Guard/Touching assist Assistive device: Walker-rolling Max distance: 15 ft   Walk 10 feet activity   Assist     Assist level: Contact Guard/Touching assist Assistive device:  Walker-rolling   Walk 50 feet activity   Assist Walk 50 feet with 2 turns activity did not occur: Safety/medical concerns         Walk 150 feet activity   Assist Walk 150 feet activity did not occur: Safety/medical concerns         Walk 10 feet on uneven surface  activity   Assist Walk 10 feet on uneven surfaces activity did not occur: Safety/medical concerns          Wheelchair     Assist Will patient use wheelchair at discharge?: Yes Type of Wheelchair: Manual    Wheelchair assist level: Supervision/Verbal cueing Max wheelchair distance: 46'    Wheelchair 50 feet with 2 turns activity    Assist        Assist Level: Supervision/Verbal cueing   Wheelchair 150 feet activity     Assist      Assist Level: Moderate Assistance - Patient 50 - 74%   Blood pressure 138/82, pulse 97, temperature 99.5 F (37.5 C), resp. rate 19, height 5\' 5"  (1.651 m), weight 110.3 kg, last menstrual period 04/15/2019, SpO2 93 %.  Medical Problem List and Plan: 1.  Deficits with mobility, transfers, self-care secondary to multi-- Ortho.             -patient may not shower             -ELOS/Goals: 10-14 days/supervision/mod I.  -team conference today             -Continue CIR therapies including PT, OT  2.  Antithrombotics: -DVT/anticoagulation:  Pharmaceutical: Lovenox             -antiplatelet therapy: N/A 3. Pain Management:  Continue gabapentin tid with Oxycodone and robaxin prn  4/20- pt reports pain well controlled with meds- con't regimen.              4. Mood: LCSW to follow for evaluation and support.              -antipsychotic agents: N/A 5. Neuropsych: This patient is capable of making decisions on her own behalf. 6. Skin/Wound Care:  Continue local care to wounds 7. Fluids/Electrolytes/Nutrition: Monitor I/O.  CMP ordered.. 8. ABLA: hgb holding at 7.8 4/19  -fe++ supp  -recheck CBC Thursday 10. Hyperglycemia: Likely stress induced.              Hemoglobin A1c ordered             Moderate increased mobility 11.  Morbid obesity: Encouraged weight loss 12. Constipation  4/17-LBM 3+ days ago- that's documented- will order sorbitol prn and suppository prn.     4/18- no BM documented back as far as 4/12- so 6 days?? Will order mg citrate- just need her to have BM.  4/20 had small  bm today. Nurse working with pt on this   -senna-s,  increase to bid   -prn sorbitol, miralax  LOS: 4 days A FACE TO [place] 04/27/2019, 11:01 AM
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
## 2019-04-27 NOTE — Progress Notes (Addendum)
Social Work Patient ID: Sarah Bennett, female   DOB: 1975/11/01, 44 y.o.   MRN: [id number]   SW met with pt in room to provide updates from team conference, and d/c date 05/01/19. SW provided pt with SSA kit so she can begin disability process.   *SW received call from pt informing SW SSDI telephone interview scheduled for 5/3. SW discussed with patient family education. Reports [name] will be here on Thursday 4/22 10am-12pm.    Loralee Pacas, MSW, [place] Office: [phone] Cell: [phone] Fax: [phone]
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
## 2019-04-27 NOTE — Plan of Care (Signed)
  Problem: Consults Goal: RH GENERAL PATIENT EDUCATION Description: See Patient Education module for education specifics. Outcome: Progressing   Problem: RH SAFETY Goal: RH STG ADHERE TO SAFETY PRECAUTIONS W/ASSISTANCE/DEVICE Description: STG Adhere to Safety Precautions With cues and reminders Outcome: Progressing Goal: RH STG DECREASED RISK OF FALL WITH ASSISTANCE Description: STG Decreased Risk of Fall With mod I assit Outcome: Progressing   Problem: RH PAIN MANAGEMENT Goal: RH STG PAIN MANAGED AT OR BELOW PT'S PAIN GOAL Description: Pain level less than 4 on scale of 0-10 Outcome: Progressing   Problem: RH KNOWLEDGE DEFICIT GENERAL Goal: RH STG INCREASE KNOWLEDGE OF SELF CARE AFTER HOSPITALIZATION Description: Pt will be able to adhere to safety precautions independently to prevent further injuries and complications.  Outcome: Progressing

## 2019-04-27 NOTE — Progress Notes (Signed)
Occupational Therapy Session Note  Patient Details  Name: Sarah Bennett MRN: [id number] Date of Birth: March 16, 1975  Today's Date: 04/27/2019 OT Individual Time: 1100-1201 OT Individual Time Calculation (min): 61 min    Short Term Goals: Week 1:  OT Short Term Goal 1 (Week 1): Pt will transfer to [facility] with S OT Short Term Goal 2 (Week 1): Pt will complete TTB transfer with A to manage LLE over tub ledge only OT Short Term Goal 3 (Week 1): Pt will don B socks with Vc for device use  Skilled Therapeutic Interventions/Progress Updates:  Session 1: Patient met lying supine in bed in agreement with OT treatment session. Focus on functional transfers, functional mobility, and wc management and mobility as detailed below. Wc mobility from room to ADL apartment with fewer cues required for navigating around obstacles, around corners, and through doorways. Patient able to return demonstrate placement/removal of foot rests this date with minimal cueing. Education on safety with bathing tasks in bathroom at home with use of [place], grab bars, and non-slip mat placement outside of shower. Patient expressed verbal understanding. Patient attempted tub/shower transfer to shower chair with use of RW but unable to complete secondary to pain in LLE 5-6/10 with knee flexion. Functional mobility to bed in ADL apartment with CGA and use of RW. Patient able to transfer from EOB <> supine x2 with use of leg lifter and supervision A with 1-2 cues for safety. Wc mobility from ADL apartment back to room. OT provided education on use of thera-putty to increase strength in L hand. Soft yellow thera-putty provided with handout to be given during afternoon session. Stand-pivot transfer from to recliner with CGA and use of RW. Session concluded with patient seated in recliner with chair alarm activated, call bell within reach, and all needs met.   Session 2: Patient met seated in recliner in agreement with OT treatment session with  focus on self-care re-education and therapeutic activity as detailed below. Thera-putty HEP with patient able to return demonstrate each exercise with moderate cueing for pace/form. Patient indicating need to void. Functional mobility from recliner to bathroom with CGA. Toilet transfer and toileting/hygiene/clothing management with CGA. Patient ambulate ~63f to sink in bathroom for hand hygiene. Patient requesting to bathe seated at sink level requiring set-up A for UB bath/dress and CGA-Min A for LB bath/dress. Patient declined bathing L upper/lower leg secondary to pain despite education on use of long-handled sponge to increase independence. Oral hygiene seated at sink level with set-up A. Light housekeeping task to organize patients belongings from wc level with supervision A and cueing for safety with wc to lock breaks. Stand-pivot from wc to EOB with CGA and return to supine with use of leg lifter and supervision A. Session concluded with patient lying supine in bed with call bell within reach, bed alarm activated and all needs met.   Therapy Documentation Precautions:  Precautions Precautions: Fall Precaution Comments: TDWB only left LE; can put weight thru left wrist Required Braces or Orthoses: Other Brace Other Brace: L velcro wrist brace for comfort Restrictions Weight Bearing Restrictions: Yes LUE Weight Bearing: Weight bearing as tolerated LLE Weight Bearing: Touchdown weight bearing  Therapy/Group: Individual Therapy  Aubreana Cornacchia R [place] 04/27/2019, 7:50 AM

## 2019-04-27 NOTE — Progress Notes (Signed)
Physical Therapy Session Note  Patient Details  Name: Sarah Bennett MRN: [id number] Date of Birth: 1975-12-15  Today's Date: 04/27/2019 PT Individual Time: 0850-1000 PT Individual Time Calculation (min): 70 min   Short Term Goals: Week 1:  PT Short Term Goal 1 (Week 1): Pt will ambulate 27ft with min assist PT Short Term Goal 2 (Week 1): Pt will propell WC 110ft with min assist PT Short Term Goal 3 (Week 1): Pt wil perform car transfer with mod assist PT Short Term Goal 4 (Week 1): Pt will initiate stair management training  Skilled Therapeutic Interventions/Progress Updates:   Pt received in supine and agreeable to therapy, pain as detailed below. Pt requesting to toilet. Min assist supine>sit for LLE management. Ambulated to/from toilet w/ CGA using RW, toilet transfer w/ CGA. Pt continent of bowel and bladder. Supervision for pericare. Pt performed self-care tasks at sink from seated level including brushing teeth and washing face. Pt self-propelled w/c to/from day room w/ supervision using BUEs, much improved from yesterday's session. Worked on LLE strengthening/ROM as listed below. Tactile and verbal cues for technique throughout. Pain in L knee 3-4/10 during exercises. Educated pt on importance of working on hamstring and gastroc flexibility outside of therapy sessions, including keeping LLE elevated in w/c or in supine.   LLE strengthening/ROM: -LAQs 3x10 -active assist ankle pump 3x10 -heel slides 3x10 -knee marches 3x10  -heel raises 3x10  -passive hamstring and gastroc stretch 30 sec hold x2  Returned to room and performed ambulatory transfer to toilet again w/ CGA. Ended session in supine, all needs in reach. Ice applied to L anterior knee and ankle for pain/swelling relief.   Therapy Documentation Precautions:  Precautions Precautions: Fall Precaution Comments: TDWB only left LE; can put weight thru left wrist Required Braces or Orthoses: Other Brace Other Brace: L  velcro wrist brace for comfort Restrictions Weight Bearing Restrictions: Yes LUE Weight Bearing: Weight bearing as tolerated LLE Weight Bearing: Touchdown weight bearing Vital Signs:   Pain: Pain Assessment Pain Scale: 0-10 Pain Score: 3  Pain Type: Acute pain Pain Location: Leg Pain Orientation: Left Pain Descriptors / Indicators: Aching Pain Frequency: Intermittent Pain Onset: Gradual Pain Intervention(s): Medication (See eMAR)(prior to therapy)  Therapy/Group: Individual Therapy  Anthonia Monger K Abdimalik Mayorquin 04/27/2019, 10:02 AM

## 2019-04-28 ENCOUNTER — Inpatient Hospital Stay (HOSPITAL_COMMUNITY): Payer: Medicaid Other | Admitting: Physical Therapy

## 2019-04-28 ENCOUNTER — Inpatient Hospital Stay (HOSPITAL_COMMUNITY): Payer: Medicaid Other | Admitting: Occupational Therapy

## 2019-04-28 ENCOUNTER — Inpatient Hospital Stay (HOSPITAL_COMMUNITY): Payer: Medicaid Other

## 2019-04-28 NOTE — Progress Notes (Signed)
Social Work Patient ID: Sarah Bennett, female   DOB: [birth year], 44 y.o.   MRN: [id number]   SW sent DME order: 3in1 BSC, showerchair with back, w/c and RW to [place] via parachute.   Loralee Pacas, MSW, [place] Office: [phone] Cell: [phone] Fax: [phone]

## 2019-04-28 NOTE — Progress Notes (Signed)
Physical Therapy Session Note  Patient Details  Name: Sarah Bennett MRN: [id number] Date of Birth: [birth year]  Today's Date: 04/28/2019 PT Individual Time: 1330-1425 PT Individual Time Calculation (min): 55 min   Short Term Goals: Week 1:  PT Short Term Goal 1 (Week 1): Pt will ambulate 36ft with min assist PT Short Term Goal 2 (Week 1): Pt will propell WC 164ft with min assist PT Short Term Goal 3 (Week 1): Pt wil perform car transfer with mod assist PT Short Term Goal 4 (Week 1): Pt will initiate stair management training  Skilled Therapeutic Interventions/Progress Updates:   Pt received in supine and agreeable to therapy, no c/o pain. Supervision bed mobility. Pt donned socks w/ use of sock aid and supervision. Stand pivot to w/c w/ close supervision w/o AD. Total assist w/c transport to/from therapy gym. Worked on problem solving stair negotiation. Trial-ed multiple techniques including sideways w/ unilateral rail (L rail, as per home set-up), RUE over therapist's shoulder and LUE on rail, and backwards w/ RW support in front of pt. Pt unsafe w/ all 3 options as she has insufficient UE strength to push up on UEs and clear R foot on step while maintaining TDWB on LLE. Pt putting too much weight on LLE w/ all attempts. Practiced shower seat technique and pt able to perform w/ min assist using L rail to boost up into stand w/ CGA-min assist, total assist to place shower seat staggered on each step. 2 legs of seated shortened maximally and 2 legs lengthened maximally to allow seat to safely span 2 steps as pt sits on seat and moves RLE to boost up on next step. Pt negotiated up/down 3 steps safely w/ min assist overall. Pt's daughter present and demonstrated this technique to her. Both pt and daughter understand importance of safe access to 2nd floor apartment and agree that getting a shower seat is a priority. Returned to room and pt ended session in w/c, all needs in reach. Ice applied to L  anterior knee and ankle for swelling/soreness relief.   Therapy Documentation Precautions:  Precautions Precautions: Fall Precaution Comments: TDWB only left LE; can put weight thru left wrist Required Braces or Orthoses: Other Brace Other Brace: L velcro wrist brace for comfort Restrictions Weight Bearing Restrictions: Yes LUE Weight Bearing: Weight bearing as tolerated LLE Weight Bearing: Touchdown weight bearing  Therapy/Group: Individual Therapy  Everlene Cunning Clent [place]ark 04/28/2019, 2:26 PM

## 2019-04-28 NOTE — Progress Notes (Signed)
Occupational Therapy Session Note  Patient Details  Name: Sarah Bennett MRN: [id number] Date of Birth: 02-Dec-1975  Today's Date: 04/28/2019 OT Individual Time: 4037-0964 OT Individual Time Calculation (min): 74 min    Short Term Goals: Week 1:  OT Short Term Goal 1 (Week 1): Pt will transfer to [facility] with S OT Short Term Goal 2 (Week 1): Pt will complete TTB transfer with A to manage LLE over tub ledge only OT Short Term Goal 3 (Week 1): Pt will don B socks with Vc for device use  Skilled Therapeutic Interventions/Progress Updates:  Patient met lying supine in bed in agreement with OT treatment session. RN present to administer meds upon entry. Patient notes increased pain last evening after yesterdays therapy sessions. Pain 4-5/10 this A.M. Supine to EOB with use of leg lifter, supervision A and increased time. Sit to stand from EOB and functional mobility to commode in bathroom with CGA and RW. Patient continent of bowel/bladder with ability to complete hygiene/clothing management seated on commode with supervision A. OT provided education on use of long-handled sponge to increase independence with LB bathing tasks. Patient expressed verbal understanding. Walk-in shower transfer to shower chair with CGA and cues for hand placement. UB bathing seated with set-up A and LB bathing in sitting/standing with CGA. Patient ambulated from bathroom to wc with CGA and cues for pacing. Dressing completed seated in wc with set-up A for UB and CGA for LB with LUE supported on sink surface. Wc to sink level for oral hygiene and grooming tasks. Wc mobility through hallways with focus on sharp turns L<>R, 360 degree turns, and rolling backwards in prep for completion of ADLs from wc level at home. Stand-pivot transfer from wc to EOB and return to supine with use of leg lifter and supervision A. Session concluded with patient lying supine in bed with bed alarm activated, call bell within reach and all needs met.    Therapy Documentation Precautions:  Precautions Precautions: Fall Precaution Comments: TDWB only left LE; can put weight thru left wrist Required Braces or Orthoses: Other Brace Other Brace: L velcro wrist brace for comfort Restrictions Weight Bearing Restrictions: Yes LUE Weight Bearing: Weight bearing as tolerated LLE Weight Bearing: Touchdown weight bearing General:    Therapy/Group: Individual Therapy  Thoren Hosang R [place] 04/28/2019, 7:38 AM

## 2019-04-28 NOTE — Progress Notes (Signed)
PHYSICAL MEDICINE & REHABILITATION PROGRESS NOTE   Subjective/Complaints: No complaints this morning. Was feeling a little down this morning but says Destanae, OT, really helped her to feel better. Pain is well controlled. Sleeping well at night for the most part.   ROS: Patient denies fever, rash, sore throat, blurred vision, nausea, vomiting, diarrhea, cough, shortness of breath or chest pain,  headache, or mood change.   Objective:   No results found. Recent Labs    04/26/19 0644  WBC 5.9  HGB 7.8*  HCT 26.2*  PLT 182   Recent Labs    04/26/19 0644  NA 135  K 4.0  CL 100  CO2 25  GLUCOSE 96  BUN 9  CREATININE 0.80  CALCIUM 8.8*    Intake/Output Summary (Last 24 hours) at 04/28/2019 1320 Last data filed at 04/28/2019 1200 Gross per 24 hour  Intake 720 ml  Output --  Net 720 ml     Physical Exam: Vital Signs Blood pressure 127/69, pulse 70, temperature 98.8 F (37.1 C), resp. rate 15, height 5\' 5"  (1.651 m), weight 110.3 kg, last menstrual period 04/15/2019, SpO2 100 %. Constitutional: No distress . Vital signs reviewed. Lying in bed comfortably HEENT: EOMI, oral membranes moist Neck: supple Cardiovascular: RRR without murmur. No JVD    Respiratory/Chest: CTA Bilaterally without wheezes or rales. Normal effort    GI/Abdomen: BS +, non-tender, non-distended Ext: no clubbing, cyanosis, or edema Psych: pleasant and cooperative Musculoskeletal:     Comments: LLE edema 2+ to midcalf --unchanged Neurological: Ox3, reasonable insight and awareness.  Motor: Right upper extremity: 5/5 proximal distal Left upper extremity: 4+/5 proximal distal, except for wrist which has brace Right lower extremity: 5/5 proximal distal Left lower extremity: 2/5 proximal distal --pain inhibition still an issue Sensation intact light touch  Skin:  Lacerations/incisions CDI with sutures, no abnl draiange       Assessment/Plan: 1. Functional deficits secondary to  [place] which require 3+ hours per day of interdisciplinary therapy in a comprehensive inpatient rehab setting.  Physiatrist is providing close team supervision and 24 hour management of active medical problems listed below.  Physiatrist and rehab team continue to assess barriers to discharge/monitor patient progress toward functional and medical goals  Care Tool:  Bathing    Body parts bathed by patient: Right arm, Left arm, Chest, Abdomen, Front perineal area, Buttocks, Right upper leg, Left upper leg, Right lower leg, Face   Body parts bathed by helper: Buttocks, Right lower leg, Left lower leg Body parts n/a: Left lower leg   Bathing assist Assist Level: Contact Guard/Touching assist     Upper Body Dressing/Undressing Upper body dressing   What is the patient wearing?: Pull over shirt    Upper body assist Assist Level: Set up assist    Lower Body Dressing/Undressing Lower body dressing    Lower body dressing activity did not occur: Refused What is the patient wearing?: Pants     Lower body assist Assist for lower body dressing: Minimal Assistance - Patient > 75%     Toileting Toileting    Toileting assist Assist for toileting: Contact Guard/Touching assist     Transfers Chair/bed transfer  Transfers assist     Chair/bed transfer assist level: Contact Guard/Touching assist     Locomotion Ambulation   Ambulation assist      Assist level: Contact Guard/Touching assist Assistive device: Walker-rolling Max distance: 15 ft   Walk 10 feet activity   Assist  Assist level: Contact Guard/Touching assist Assistive device: Walker-rolling   Walk 50 feet activity   Assist Walk 50 feet with 2 turns activity did not occur: Safety/medical concerns         Walk 150 feet activity   Assist Walk 150 feet activity did not occur: Safety/medical concerns         Walk 10 feet on uneven surface  activity   Assist Walk 10 feet on  uneven surfaces activity did not occur: Safety/medical concerns         Wheelchair     Assist Will patient use wheelchair at discharge?: Yes Type of Wheelchair: Manual    Wheelchair assist level: Supervision/Verbal cueing Max wheelchair distance: 71'    Wheelchair 50 feet with 2 turns activity    Assist        Assist Level: Supervision/Verbal cueing   Wheelchair 150 feet activity     Assist      Assist Level: Moderate Assistance - Patient 50 - 74%   Blood pressure 127/69, pulse 70, temperature 98.8 F (37.1 C), resp. rate 15, height 5\' 5"  (1.651 m), weight 110.3 kg, last menstrual period 04/15/2019, SpO2 100 %.  Medical Problem List and Plan: 1.  Deficits with mobility, transfers, self-care secondary to multi-- Ortho.             -patient may not shower             -ELOS/Goals: 10-14 days/supervision/mod I.             -Continue CIR therapies including PT, OT  2.  Antithrombotics: -DVT/anticoagulation:  Pharmaceutical: Lovenox             -antiplatelet therapy: N/A 3. Pain Management:  Continue gabapentin tid with Oxycodone and robaxin prn  4/21- pt reports 4-5 pain this morning but was able to tolerate session well.   4. Mood: LCSW to follow for evaluation and support.              -antipsychotic agents: N/A 5. Neuropsych: This patient is capable of making decisions on her own behalf. 6. Skin/Wound Care:  Continue local care to wounds 7. Fluids/Electrolytes/Nutrition: Monitor I/O.  4/19 CMP is stable 8. ABLA: hgb holding at 7.8 4/19  -fe++ supp  -recheck CBC Thursday 10. Hyperglycemia: Likely stress induced.              Hemoglobin A1c ordered             Moderate increased mobility 11.  Morbid obesity: Encouraged weight loss 12. Constipation  4/17-LBM 3+ days ago- that's documented- will order sorbitol prn and suppository prn.     4/18- no BM documented back as far as 4/12- so 6 days?? Will order mg citrate- just need her to have BM.  4/20 had  small  bm today. Nurse working with pt on this   -senna-s, increase to bid   -prn sorbitol, miralax  LOS: 5 days A FACE TO FACE EVALUATION WAS [place] 04/28/2019, 1:20 PM

## 2019-04-28 NOTE — Progress Notes (Signed)
Occupational Therapy Session Note  Patient Details  Name: Sarah Bennett MRN: [id number] Date of Birth: [birth year]  Today's Date: 04/28/2019 OT Individual Time: 1106-1200 OT Individual Time Calculation (min): 54 min    Short Term Goals: Week 1:  OT Short Term Goal 1 (Week 1): Pt will transfer to [facility] with S OT Short Term Goal 2 (Week 1): Pt will complete TTB transfer with A to manage LLE over tub ledge only OT Short Term Goal 3 (Week 1): Pt will don B socks with Vc for device use  Skilled Therapeutic Interventions/Progress Updates:    1:1. Pt received in bed agreeable to OT but reporting 4/10 pain at rest and increased during tx. Rest and repositioning provided PRN. Pt completes donning socks seated EOB with sock aide for R sock. Pt competes 2x TTB transfer with VC for use of leg lifter to get LLE over tub ledge. Initial trial with CGA fading to S for second trial. Attempted use of shower chiar facing tub ledge and backing up to sit onto seat as this is likely DME used for going up steps, however pt not tall enough to sit hips back safely onto seat. Exited session with pt seated in bed, exit alram on and call light in reach  Therapy Documentation Precautions:  Precautions Precautions: Fall Precaution Comments: TDWB only left LE; can put weight thru left wrist Required Braces or Orthoses: Other Brace Other Brace: L velcro wrist brace for comfort Restrictions Weight Bearing Restrictions: Yes LUE Weight Bearing: Weight bearing as tolerated LLE Weight Bearing: Touchdown weight bearing General:   Vital Signs:   Pain: Pain Assessment Pain Score: 2  ADL: ADL Eating: Independent Grooming: Setup Where Assessed-Grooming: (shower) Upper Body Bathing: Setup Where Assessed-Upper Body Bathing: Shower Lower Body Bathing: Maximal assistance Where Assessed-Lower Body Bathing: Shower Upper Body Dressing: Minimal assistance Where Assessed-Upper Body Dressing: Edge of bed Lower Body  Dressing: Maximal assistance(clinical judgement) Where Assessed-Lower Body Dressing: Edge of bed Toileting: Minimal assistance Where Assessed-Toileting: Bedside Commode Toilet Transfer: Minimal assistance Toilet Transfer Method: Counselling psychologist: Geophysical data processor: Minimal assistance Social research officer, government Method: Heritage manager: Civil engineer, contracting with back, Systems analyst    Praxis   Exercises:   Other Treatments:     Therapy/Group: Individual Therapy  Tonny Branch 04/28/2019, 12:03 PM

## 2019-04-29 ENCOUNTER — Ambulatory Visit (HOSPITAL_COMMUNITY): Payer: Medicaid Other | Admitting: Physical Therapy

## 2019-04-29 ENCOUNTER — Encounter (HOSPITAL_COMMUNITY): Payer: Medicaid Other | Admitting: Occupational Therapy

## 2019-04-29 ENCOUNTER — Inpatient Hospital Stay (HOSPITAL_COMMUNITY): Payer: Medicaid Other | Admitting: Occupational Therapy

## 2019-04-29 DIAGNOSIS — F329 Major depressive disorder, single episode, unspecified: Secondary | ICD-10-CM

## 2019-04-29 MED ORDER — ACETAMINOPHEN 325 MG PO TABS: 325.0000 mg | ORAL_TABLET | ORAL | Status: AC | PRN

## 2019-04-29 MED ORDER — TRAMADOL HCL 50 MG PO TABS
50.0000 mg | ORAL_TABLET | Freq: Four times a day (QID) | ORAL | Status: DC | PRN
  Administered 2019-05-01: 50 mg via ORAL
  Filled 2019-04-29: qty 1
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
## 2019-04-29 NOTE — Plan of Care (Signed)
  Problem: Consults Goal: RH GENERAL PATIENT EDUCATION Description: See Patient Education module for education specifics. Outcome: Progressing   Problem: RH SAFETY Goal: RH STG ADHERE TO SAFETY PRECAUTIONS W/ASSISTANCE/DEVICE Description: STG Adhere to Safety Precautions With cues and reminders Outcome: Progressing Goal: RH STG DECREASED RISK OF FALL WITH ASSISTANCE Description: STG Decreased Risk of Fall With mod I assit Outcome: Progressing   Problem: RH PAIN MANAGEMENT Goal: RH STG PAIN MANAGED AT OR BELOW PT'S PAIN GOAL Description: Pain level less than 4 on scale of 0-10 Outcome: Progressing   Problem: RH KNOWLEDGE DEFICIT GENERAL Goal: RH STG INCREASE KNOWLEDGE OF SELF CARE AFTER HOSPITALIZATION Description: Pt will be able to adhere to safety precautions independently to prevent further injuries and complications.  Outcome: Progressing

## 2019-04-29 NOTE — Progress Notes (Signed)
[facility] PHYSICAL MEDICINE & REHABILITATION PROGRESS NOTE   Subjective/Complaints: No new issues. Leg pain is improving. t.   ROS: Patient denies fever, rash, sore throat, blurred vision, nausea, vomiting, diarrhea, cough, shortness of breath or chest pain, joint or back pain, headache, or mood change.    Objective:   No results found. No results for input(s): WBC, HGB, HCT, PLT in the last 72 hours. No results for input(s): NA, K, CL, CO2, GLUCOSE, BUN, CREATININE, CALCIUM in the last 72 hours.  Intake/Output Summary (Last 24 hours) at 04/29/2019 0856 Last data filed at 04/29/2019 0807 Gross per 24 hour  Intake 820 ml  Output --  Net 820 ml     Physical Exam: Vital Signs Blood pressure 127/68, pulse 77, temperature 98.2 F (36.8 C), temperature source Oral, resp. rate 16, height 5\' 5"  (1.651 m), weight 110.3 kg, last menstrual period 04/15/2019, SpO2 99 %. Constitutional: No distress . Vital signs reviewed. HEENT: EOMI, oral membranes moist Neck: supple Cardiovascular: RRR without murmur. No JVD    Respiratory/Chest: CTA Bilaterally without wheezes or rales. Normal effort    GI/Abdomen: BS +, non-tender, non-distended Ext: no clubbing, cyanosis, or edema Psych: pleasant and cooperative Musculoskeletal:     Comments: LLE edema 1+ to midcalf --leg less tender with ROM Neurological: Ox3, reasonable insight and awareness.  Motor: Right upper extremity: 5/5 proximal distal Left upper extremity: 4+/5 proximal distal, except for wrist which has brace Right lower extremity: 5/5 proximal distal Left lower extremity: 2/5 proximal distal --pain inhibition still an issue Sensation intact light touch  Skin:  Lacerations/incisions CDI with sutures, minimal drainage       Assessment/Plan: 1. Functional deficits secondary to [place] which require 3+ hours per day of interdisciplinary therapy in a comprehensive inpatient rehab setting.  Physiatrist is providing  close team supervision and 24 hour management of active medical problems listed below.  Physiatrist and rehab team continue to assess barriers to discharge/monitor patient progress toward functional and medical goals  Care Tool:  Bathing    Body parts bathed by patient: Right arm, Left arm, Chest, Abdomen, Front perineal area, Buttocks, Right upper leg, Left upper leg, Right lower leg, Face   Body parts bathed by helper: Buttocks, Right lower leg, Left lower leg Body parts n/a: Left lower leg   Bathing assist Assist Level: Contact Guard/Touching assist     Upper Body Dressing/Undressing Upper body dressing   What is the patient wearing?: Pull over shirt    Upper body assist Assist Level: Set up assist    Lower Body Dressing/Undressing Lower body dressing    Lower body dressing activity did not occur: Refused What is the patient wearing?: Pants     Lower body assist Assist for lower body dressing: Minimal Assistance - Patient > 75%     Toileting Toileting    Toileting assist Assist for toileting: Contact Guard/Touching assist     Transfers Chair/bed transfer  Transfers assist     Chair/bed transfer assist level: Supervision/Verbal cueing     Locomotion Ambulation   Ambulation assist      Assist level: Contact Guard/Touching assist Assistive device: Walker-rolling Max distance: 15 ft   Walk 10 feet activity   Assist     Assist level: Contact Guard/Touching assist Assistive device: Walker-rolling   Walk 50 feet activity   Assist Walk 50 feet with 2 turns activity did not occur: Safety/medical concerns         Walk 150 feet activity  Assist Walk 150 feet activity did not occur: Safety/medical concerns         Walk 10 feet on uneven surface  activity   Assist Walk 10 feet on uneven surfaces activity did not occur: Safety/medical concerns         Wheelchair     Assist Will patient use wheelchair at discharge?: Yes Type  of Wheelchair: Manual    Wheelchair assist level: Supervision/Verbal cueing Max wheelchair distance: 77'    Wheelchair 50 feet with 2 turns activity    Assist        Assist Level: Supervision/Verbal cueing   Wheelchair 150 feet activity     Assist      Assist Level: Moderate Assistance - Patient 50 - 74%   Blood pressure 127/68, pulse 77, temperature 98.2 F (36.8 C), temperature source Oral, resp. rate 16, height 5\' 5"  (1.651 m), weight 110.3 kg, last menstrual period 04/15/2019, SpO2 99 %.  Medical Problem List and Plan: 1.  Deficits with mobility, transfers, self-care secondary to multi-- Ortho.             -patient may not shower             -ELOS/Goals: 4/24/supervision/mod I.             -Continue CIR therapies including PT, OT  2.  Antithrombotics: -DVT/anticoagulation:  Pharmaceutical: Lovenox             -antiplatelet therapy: N/A 3. Pain Management:  Continue gabapentin tid with Oxycodone and robaxin prn  4/22 pain under reasonable control   4. Mood: LCSW to follow for evaluation and support.              -antipsychotic agents: N/A  -neuropsych followup. Pt says she saw dr. Sima Matas. I don't see a note 5. Neuropsych: This patient is capable of making decisions on her own behalf. 6. Skin/Wound Care:  Continue local care to wounds 7. Fluids/Electrolytes/Nutrition: Monitor I/O.  4/19 CMP is stable 8. ABLA: hgb holding at 7.8 4/19  -fe++ supp  -recheck CBC Friday 10. Hyperglycemia: Likely stress induced.              Hemoglobin A1c ordered             Moderate increased mobility 11.  Morbid obesity: Encouraged weight loss 12. Constipation  4/17-LBM 3+ days ago- that's documented- will order sorbitol prn and suppository prn.     4/18- no BM documented back as far as 4/12- so 6 days?? Will order mg citrate- just need her to have BM.  4/22 had large bm yesterday   -senna-s, increase  bid   -prn sorbitol, miralax  LOS: 6 days A FACE TO FACE  EVALUATION WAS PERFORMED  Meredith Staggers 04/29/2019, 8:56 AM

## 2019-04-29 NOTE — Progress Notes (Addendum)
Occupational Therapy Session Note  Patient Details  Name: Sarah Bennett MRN: [id number] Date of Birth: Nov 19, 1975  Today's Date: 04/29/2019 OT Individual Time: 1103-1200 OT Individual Time Calculation (min): 57 min   OT Individual Time: 1415-1530 OT Individual Time Calculation (min): 75 min   Short Term Goals: Week 1:  OT Short Term Goal 1 (Week 1): Pt will transfer to [facility] with S OT Short Term Goal 2 (Week 1): Pt will complete TTB transfer with A to manage LLE over tub ledge only OT Short Term Goal 3 (Week 1): Pt will don B socks with Vc for device use  Skilled Therapeutic Interventions/Progress Updates:  Session 1: Patient met seated in wc in agreement with OT treatment session with focus on functional transfers in prep for safe d/c home. Patient's daughter present for family education. Patient reports visiting bathroom alone without assistance this A.M. OT provided education on need for CGA for safety. Wc mobility from room to ADL apartment with 1 rest break secondary to fatigue. OT provided education on safety with shower transfers with option of use of shower chair provided through insurance or private pay for TTB. Printout of TTB options to be provided during afternoon session. Blocked practice for tub/shower tranfers with use of shower chair and TTB. Daughter able to return demonstrate appropriate level of assistance to maximize patient's independence. Patient continues to be limited by decreased LLE knee flexion and pain with movement. Patient notes desire to sponge bathe until LLE knee ROM increases enough to allow patient to complete tub/shower transfers with less pain. Education on safety with sponge bathing seated at sink level including need for CGA initially when washing front perineal area and buttocks. Patient/family in agreement. Patient aware of need for CGA for ambulation more than a few feet and need to place [facility] outside of bathroom when daughter isn't home. In ADL kitchen, OT  provided education on safety including placing frequently used items on counter surface, placement of prepared meals at shoulder height in fridge, and management of wc parts to increase accessibility in kitchen. Wc mobility back to room with supervision A. Session concluded with patient seated in wc with call bell within reach, all needs met and daughter present at bedside.   Session 2: Patient met lying supine in bed in agreement with OT treatment session. Shortly after OT's entrance, patient became tearful when voicing frustration with family situation involving her daughter. OT provided emotional support. 5/10 pain in LLE with RN present for medication management. Supine to EOB with supervision A and use of leg lifter. Patient ambulated ~44f to commode in bathroom with CGA and cues for pacing. Toileting/hygiene/clothing management with supervision A. Patient indicating request to complete duration of treatment session outside for a change of scenery. Patient transported to [facility] for energy conservation secondary to fatigue and pain from earlier treatment sessions. Wc mobility outside with patient able to manage wheelchair on uneven surfaces with supervision A and occasional Min A on slopes. Patient able to return demonstrate thera-putty HEP with I. Total A provided for return from central elevators to room for time management. Session concluded with patient lying supine in bed with call bell within reach, bed alarm activated, and all needs met.   Therapy Documentation Precautions:  Precautions Precautions: Fall Precaution Comments: TDWB only left LE; can put weight thru left wrist Required Braces or Orthoses: Other Brace Other Brace: L velcro wrist brace for comfort Restrictions Weight Bearing Restrictions: Yes LUE Weight Bearing: Weight bearing as tolerated LLE  Weight Bearing: Touchdown weight bearing  Therapy/Group: Individual Therapy  Lani Havlik R Howerton-Davis 04/29/2019, 7:58  AM

## 2019-04-29 NOTE — Progress Notes (Signed)
Physical Therapy Session Note  Patient Details  Name: Sarah Bennett MRN: [id number] Date of Birth: 05-07-1975  Today's Date: 04/29/2019 PT Individual Time: 1005-1100 PT Individual Time Calculation (min): 55 min   Short Term Goals: Week 1:  PT Short Term Goal 1 (Week 1): Pt will ambulate 41ft with min assist PT Short Term Goal 2 (Week 1): Pt will propell WC 11ft with min assist PT Short Term Goal 3 (Week 1): Pt wil perform car transfer with mod assist PT Short Term Goal 4 (Week 1): Pt will initiate stair management training  Skilled Therapeutic Interventions/Progress Updates:   Pt received in supine, agreeable to therapy and denies pain.Pt performed bed mobility w/ supervision and supervision stand pivot to new w/c she will be going home with. Therapist adjusted leg rests and instructed pt on w/c parts management. Pt self-propelled w/c towards therapy gym w/ supervision using BUEs x75', total assist remainder of way. Practiced stair negotiation w/ L rail and shower seat as detailed in yesterday's therapy session documentation. Negotiated up/down 10 steps (as per home set-up) w/ CGA-min assist overall. 1 verbal cue needed for technique. Pt's daughter arrived at this point for caregiver edu. Educated daughter on providing supervision for car transfer and w/ gait x15' w/ RW. Educated her on w/c parts management as well. Discussed importance of pt remaining at w/c level unless she has supervision. Daughter and pt verbalized understanding and in agreement. Returned to room and pt ended session in w/c, all needs in reach.   Therapy Documentation Precautions:  Precautions Precautions: Fall Precaution Comments: TDWB only left LE; can put weight thru left wrist Required Braces or Orthoses: Other Brace Other Brace: L velcro wrist brace for comfort Restrictions Weight Bearing Restrictions: Yes LUE Weight Bearing: Weight bearing as tolerated LLE Weight Bearing: Touchdown weight  bearing  Therapy/Group: Individual Therapy  [name] Clent Demark 04/29/2019, 12:37 PM
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
## 2019-04-30 ENCOUNTER — Inpatient Hospital Stay (HOSPITAL_COMMUNITY): Payer: Medicaid Other | Admitting: Physical Therapy

## 2019-04-30 ENCOUNTER — Encounter (HOSPITAL_COMMUNITY): Payer: Medicaid Other | Admitting: Psychology

## 2019-04-30 ENCOUNTER — Inpatient Hospital Stay (HOSPITAL_COMMUNITY): Payer: Medicaid Other

## 2019-04-30 ENCOUNTER — Inpatient Hospital Stay (HOSPITAL_COMMUNITY): Payer: Medicaid Other | Admitting: Occupational Therapy

## 2019-04-30 LAB — CBC
HCT: 26.4 % — ABNORMAL LOW (ref 36.0–46.0)
Hemoglobin: 7.6 g/dL — ABNORMAL LOW (ref 12.0–15.0)
MCH: 22.8 pg — ABNORMAL LOW (ref 26.0–34.0)
MCHC: 28.8 g/dL — ABNORMAL LOW (ref 30.0–36.0)
MCV: 79.3 fL — ABNORMAL LOW (ref 80.0–100.0)
Platelets: 243 10*3/uL (ref 150–400)
RBC: 3.33 MIL/uL — ABNORMAL LOW (ref 3.87–5.11)
RDW: 17.8 % — ABNORMAL HIGH (ref 11.5–15.5)
WBC: 5.4 10*3/uL (ref 4.0–10.5)
nRBC: 0 % (ref 0.0–0.2)

## 2019-04-30 MED ORDER — POLYETHYLENE GLYCOL 3350 17 G PO PACK: 17.0000 g | PACK | Freq: Every day | ORAL | 0 refills | Status: AC | PRN

## 2019-04-30 MED ORDER — PANTOPRAZOLE SODIUM 40 MG PO TBEC: 40.0000 mg | DELAYED_RELEASE_TABLET | Freq: Every day | ORAL | 0 refills | Status: AC

## 2019-04-30 MED ORDER — OXYCODONE HCL 10 MG PO TABS: 5.0000 mg | ORAL_TABLET | Freq: Four times a day (QID) | ORAL | 0 refills | Status: DC | PRN

## 2019-04-30 MED ORDER — CITALOPRAM HYDROBROMIDE 10 MG PO TABS
10.0000 mg | ORAL_TABLET | Freq: Every day | ORAL | Status: DC
  Administered 2019-04-30: 10 mg via ORAL
  Filled 2019-04-30: qty 1

## 2019-04-30 MED ORDER — CITALOPRAM HYDROBROMIDE 10 MG PO TABS: 10.0000 mg | ORAL_TABLET | Freq: Every day | ORAL | 0 refills | Status: AC

## 2019-04-30 MED ORDER — ENOXAPARIN SODIUM 40 MG/0.4ML ~~LOC~~ SOLN: 40.0000 mg | SUBCUTANEOUS | 0 refills | Status: AC

## 2019-04-30 MED ORDER — POLYSACCHARIDE IRON COMPLEX 150 MG PO CAPS: 150.0000 mg | ORAL_CAPSULE | Freq: Two times a day (BID) | ORAL | 0 refills | Status: AC

## 2019-04-30 MED ORDER — METHOCARBAMOL 500 MG PO TABS: 500.0000 mg | ORAL_TABLET | Freq: Four times a day (QID) | ORAL | 0 refills | Status: AC | PRN

## 2019-04-30 MED ORDER — SENNOSIDES-DOCUSATE SODIUM 8.6-50 MG PO TABS: 2.0000 | ORAL_TABLET | Freq: Two times a day (BID) | ORAL | 0 refills | Status: AC

## 2019-04-30 MED ORDER — GABAPENTIN 100 MG PO CAPS: 100.0000 mg | ORAL_CAPSULE | Freq: Three times a day (TID) | ORAL | 0 refills | Status: AC

## 2019-04-30 MED ORDER — TRAMADOL HCL 50 MG PO TABS: 50.0000 mg | ORAL_TABLET | Freq: Four times a day (QID) | ORAL | 0 refills | Status: DC | PRN

## 2019-04-30 NOTE — Progress Notes (Signed)
Occupational Therapy Session Note  Patient Details  Name: Sarah Bennett MRN: [id number] Date of Birth: [birth year]  Today's Date: 04/30/2019 OT Individual Time: 0918-1000 OT Individual Time Calculation (min): 42 min    Short Term Goals: Week 1:  OT Short Term Goal 1 (Week 1): Pt will transfer to [facility] with S OT Short Term Goal 2 (Week 1): Pt will complete TTB transfer with A to manage LLE over tub ledge only OT Short Term Goal 3 (Week 1): Pt will don B socks with Vc for device use  Skilled Therapeutic Interventions/Progress Updates:  Patient met lying supine in bed in agreement with OT treatment session. Patient performed supine to EOB with use of leg lifter and stand-pivot transfer to wc with use of RW. Wc mobility to dayroom with Mod I. Patient demonstrates fair recall of BUE HEP with level 2 theraband. OT answered all patients questions. Patient performed BUE HEP for 1 set x10 reps each as detailed below. OT provided printout of TTB which patient intends on purchasing out of pocket online once she gets home. Session concluded with patient lying supine in bed with bed alarm activated, call bell within reach, and all needs met.   Therapy Documentation Precautions:  Precautions Precautions: Fall Precaution Comments: TDWB only left LE; can put weight thru left wrist Required Braces or Orthoses: Other Brace Other Brace: L velcro wrist brace for comfort Restrictions Weight Bearing Restrictions: Yes LUE Weight Bearing: Weight bearing as tolerated LLE Weight Bearing: Touchdown weight bearing  Therapy/Group: Individual Therapy  Korban Shearer R Howerton-Davis 04/30/2019, 8:00 AM

## 2019-04-30 NOTE — Plan of Care (Signed)
  Problem: Consults Goal: RH GENERAL PATIENT EDUCATION Description: See Patient Education module for education specifics. Outcome: Progressing   Problem: RH SAFETY Goal: RH STG ADHERE TO SAFETY PRECAUTIONS W/ASSISTANCE/DEVICE Description: STG Adhere to Safety Precautions With cues and reminders Outcome: Progressing Goal: RH STG DECREASED RISK OF FALL WITH ASSISTANCE Description: STG Decreased Risk of Fall With mod I assit Outcome: Progressing   Problem: RH PAIN MANAGEMENT Goal: RH STG PAIN MANAGED AT OR BELOW PT'S PAIN GOAL Description: Pain level less than 4 on scale of 0-10 Outcome: Progressing

## 2019-04-30 NOTE — Progress Notes (Signed)
[facility] PHYSICAL MEDICINE & REHABILITATION PROGRESS NOTE   Subjective/Complaints: Overall feeling well. Still anxious about going home. Pain stable but left leg still tender.   ROS: Patient denies fever, rash, sore throat, blurred vision, nausea, vomiting, diarrhea, cough, shortness of breath or chest pain,  Headache .    Objective:   No results found. Recent Labs    04/30/19 0638  WBC 5.4  HGB 7.6*  HCT 26.4*  PLT 243   No results for input(s): NA, K, CL, CO2, GLUCOSE, BUN, CREATININE, CALCIUM in the last 72 hours.  Intake/Output Summary (Last 24 hours) at 04/30/2019 1122 Last data filed at 04/30/2019 0845 Gross per 24 hour  Intake 780 ml  Output --  Net 780 ml     Physical Exam: Vital Signs Blood pressure 125/77, pulse 70, temperature 98.3 F (36.8 C), temperature source Oral, resp. rate 18, height 5\' 5"  (1.651 m), weight 110.1 kg, last menstrual period 04/15/2019, SpO2 99 %. Constitutional: No distress . Vital signs reviewed. HEENT: EOMI, oral membranes moist Neck: supple Cardiovascular: RRR without murmur. No JVD    Respiratory/Chest: CTA Bilaterally without wheezes or rales. Normal effort    GI/Abdomen: BS +, non-tender, non-distended Ext: no clubbing, cyanosis, or edema Psych: pleasant but became very anxious as I examined/removed dressing to look at left leg wounds Musculoskeletal:     Comments: LLE edema 1+ to midcalf --leg remains tender with ROM Neurological: Ox3, reasonable insight and awareness.  Motor: Right upper extremity: 5/5 proximal distal Left upper extremity: 4+/5 proximal distal, except for wrist which has brace Right lower extremity: 5/5 proximal distal Left lower extremity: 2/5 proximal distal --pain inhibition still an issue Sensation intact light touch  Skin:  Incisions all CDI with staples/suture LLE.        Assessment/Plan: 1. Functional deficits secondary to [place] which require 3+ hours per day of  interdisciplinary therapy in a comprehensive inpatient rehab setting.  Physiatrist is providing close team supervision and 24 hour management of active medical problems listed below.  Physiatrist and rehab team continue to assess barriers to discharge/monitor patient progress toward functional and medical goals  Care Tool:  Bathing    Body parts bathed by patient: Right arm, Left arm, Chest, Abdomen, Front perineal area, Buttocks, Right upper leg, Left upper leg, Right lower leg, Face, Left lower leg   Body parts bathed by helper: Buttocks, Right lower leg, Left lower leg Body parts n/a: Left lower leg   Bathing assist Assist Level: Set up assist     Upper Body Dressing/Undressing Upper body dressing   What is the patient wearing?: Dress    Upper body assist Assist Level: Independent with assistive device    Lower Body Dressing/Undressing Lower body dressing    Lower body dressing activity did not occur: Refused What is the patient wearing?: Underwear/pull up     Lower body assist Assist for lower body dressing: Independent with assitive device Assistive Device Comment: Automotive engineer    Toileting assist Assist for toileting: Independent with assistive device     Transfers Chair/bed transfer  Transfers assist     Chair/bed transfer assist level: Supervision/Verbal cueing     Locomotion Ambulation   Ambulation assist      Assist level: Supervision/Verbal cueing Assistive device: Walker-rolling Max distance: 15 ft   Walk 10 feet activity   Assist     Assist level: Supervision/Verbal cueing Assistive device: Walker-rolling   Walk 50 feet activity   Assist Walk  50 feet with 2 turns activity did not occur: Safety/medical concerns         Walk 150 feet activity   Assist Walk 150 feet activity did not occur: Safety/medical concerns         Walk 10 feet on uneven surface  activity   Assist Walk 10 feet on uneven  surfaces activity did not occur: Safety/medical concerns         Wheelchair     Assist Will patient use wheelchair at discharge?: Yes Type of Wheelchair: Manual    Wheelchair assist level: Supervision/Verbal cueing Max wheelchair distance: 53'    Wheelchair 50 feet with 2 turns activity    Assist        Assist Level: Supervision/Verbal cueing   Wheelchair 150 feet activity     Assist      Assist Level: Moderate Assistance - Patient 50 - 74%   Blood pressure 125/77, pulse 70, temperature 98.3 F (36.8 C), temperature source Oral, resp. rate 18, height 5\' 5"  (1.651 m), weight 110.1 kg, last menstrual period 04/15/2019, SpO2 99 %.  Medical Problem List and Plan: 1.  Deficits with mobility, transfers, self-care secondary to multi-- Ortho.             -patient may not shower             -ELOS/Goals: 4/24, No HH             -Continue CIR therapies including PT, OT  2.  Antithrombotics: -DVT/anticoagulation:  Pharmaceutical: Lovenox             -antiplatelet therapy: N/A 3. Pain Management:  Continue gabapentin tid with Oxycodone and robaxin prn  4/23 pain under reasonable control for the most part, but anxiety is high  4. Mood: LCSW to follow for evaluation and support.              -antipsychotic agents: N/A  -neuropsych feedback appreciated. Will initiate low dose celexa to help with anxiety.  5. Neuropsych: This patient is capable of making decisions on her own behalf. 6. Skin/Wound Care:  Remove sutures and staples tomorrow 7. Fluids/Electrolytes/Nutrition: Monitor I/O.  4/19 CMP is stable 8. ABLA: hgb holding at 7.6 today, no blood loss, better hydrated  -continue fe++ supp  -recheck CBC as outpt if sx indicate 10. Hyperglycemia: hgb A1C WNL 11.  Morbid obesity: Encouraged weight loss 12. Constipation  4/17-LBM 3+ days ago- that's documented- will order sorbitol prn and suppository prn.     4/18- no BM documented back as far as 4/12- so 6 days?? Will  order mg citrate- just need her to have BM.  4/23 had large bm yesterday   -senna-s, increase  bid   -prn sorbitol, miralax  LOS: 7 days A FACE TO FACE EVALUATION WAS PERFORMED  Meredith Staggers 04/30/2019, 11:22 AM

## 2019-04-30 NOTE — Discharge Instructions (Signed)
Inpatient Rehab Discharge Instructions  Sarah Bennett Discharge date and time:  05/01/19  Activities/Precautions/ Functional Status: Activity: no lifting, driving, or strenuous exercise till cleared by MD. Limit to touch down weight on left leg Diet: regular diet Wound Care: Keep wound clean and dry --Contact Dr. Ninfa Linden if you develop any problems with your incision/wound--redness, swelling, increase in pain, drainage or if you develop fever or chills.   Functional status:  ___ No restrictions     ___ Walk up steps independently ___ 24/7 supervision/assistance   ___ Walk up steps with assistance _X__ Intermittent supervision/assistance  ___ Bathe/dress independently ___ Walk with walker     ___ Bathe/dress with assistance ___ Walk Independently    ___ Shower independently ___ Walk with assistance    ___ Shower with assistance _X__ No alcohol     ___ Return to work/school ________  COMMUNITY REFERRALS UPON DISCHARGE:    Medical Equipment/Items Ordered: rolling walker, wheelchair, 3in1 bedside commode, shower chair with back                                                 Agency/Supplier: [facility] [phone]  GENERAL COMMUNITY RESOURCES FOR PATIENT/FAMILY: New patient appointment/hospital follow-up: Friday, May 14 at 9:30am with Juluis Mire, NP [place], [place], [place] [address] [phone]   Special Instructions: 1. Touch down weight on left leg. OK to transfer from your chair but do not walk without supervision.  2. Continue to do home exercises at least 2-3 times a day to help build your strength.  3. You need to keep your appointment with Ms. Oletta Lamas so that you can have a primary provider.     My questions have been answered and I understand these instructions. I will adhere to these goals and the provided educational materials after my discharge from the hospital.  Patient/Caregiver Signature  _______________________________ Date __________  Clinician Signature _______________________________________ Date __________  Please bring this form and your medication list with you to all your follow-up doctor's appointments.

## 2019-04-30 NOTE — Progress Notes (Signed)
Patient able to demonstrate proper  Self administration of Lovenox injection with minimal cueing.

## 2019-04-30 NOTE — Progress Notes (Signed)
Occupational Therapy Discharge Summary  Patient Details  Name: Sarah Bennett MRN: [id number] Date of Birth: 01-May-1975  Today's Date: 04/30/2019 OT Individual Time: 0700-0809 OT Individual Time Calculation (min): 69 min   Pt received in bed eating breakfast with no pain reported but stomach mildly upset. No intervention requested. Pt completes all mobility with RW at ambulatory level for all bathroom transfers. Pt completes toileting, bathing at shower level and dressing at sit to stand level at sink with MOD I (S for shower transfer and set up for bathing). Pt utilizes all AE without cuing for doffing/donning LB clothing/bathing feet. Pt grooms at sink seated for energy conservation with mod I. HEP theraband provided at end of session along with level 2 and 3 resistance band. Pt will review HEP on own time and ask any questions to next OT for clarification. Pictures and words provided on handout for technique. Exited session with pt seated in w/c and exit alram on   Patient has met 9 of 9 long term goals due to improved activity tolerance, improved balance, postural control and ability to compensate for deficits.  Patient to discharge at overall Modified Independent level.  Patient's care partner is independent to provide the necessary  supervision  assistance at discharge. Pts family was present for family training during stay to go over mobility and transfers within ADL. Family able ot return demo supervision of TTB transfers for safety  Reasons goals not met: n/a  Recommendation:  Patient will benefit from ongoing skilled OT services in home health setting to continue to advance functional skills in the area of BADL and iADL.  Equipment: BSC, Shower chair, Pt to purchase TTB privately  Reasons for discharge: treatment goals met  Patient/family agrees with progress made and goals achieved: Yes  OT Discharge Precautions/Restrictions  Precautions Precaution Comments: TDWB only left LE;  can put weight thru left wrist Required Braces or Orthoses: Other Brace Other Brace: L velcro wrist brace for comfort Restrictions Weight Bearing Restrictions: Yes LUE Weight Bearing: Weight bearing as tolerated LLE Weight Bearing: Touchdown weight bearing General Chart Reviewed: Yes OT Amount of Missed Time: 11 Minutes Family/Caregiver Present: No Vital Signs Therapy Vitals Temp: 98.3 F (36.8 C) Temp Source: Oral Pulse Rate: 70 Resp: 18 BP: 125/77 Patient Position (if appropriate): Lying Oxygen Therapy SpO2: 99 % O2 Device: Room Air Pain   ADL ADL Eating: Independent Grooming: Independent Where Assessed-Grooming: (shower) Upper Body Bathing: Independent Where Assessed-Upper Body Bathing: Shower Lower Body Bathing: Setup Where Assessed-Lower Body Bathing: Shower Upper Body Dressing: Modified independent (Device) Where Assessed-Upper Body Dressing: Edge of bed Lower Body Dressing: Modified independent Where Assessed-Lower Body Dressing: Edge of bed Toileting: Supervision/safety Where Assessed-Toileting: Bedside Commode, Control and instrumentation engineer Transfer: Modified independent Armed forces technical officer Method: Counselling psychologist: Radiographer, therapeutic: Close supervison Clinical cytogeneticist Method: Optometrist: Facilities manager: Close supervision Social research officer, government Method: Heritage manager: Civil engineer, contracting with back, Grab bars Vision Baseline Vision/History: No visual deficits Patient Visual Report: No change from baseline Vision Assessment?: No apparent visual deficits Perception  Perception: Within Functional Limits Praxis Praxis: Intact Cognition Overall Cognitive Status: Within Functional Limits for tasks assessed Awareness: Appears intact Problem Solving: Appears intact Safety/Judgment: Appears intact Sensation Sensation Light Touch: Appears Intact Coordination Gross Motor  Movements are Fluid and Coordinated: Yes(UE) Fine Motor Movements are Fluid and Coordinated: Yes(UE) Motor  Motor Motor: Within Functional Limits Motor - Skilled Clinical Observations: WFL. pain in LLE Mobility  Bed  Mobility Supine to Sit: Independent with assistive device Sit to Supine: Independent with assistive device Transfers Sit to Stand: Independent with assistive device Stand to Sit: Independent with assistive device  Trunk/Postural Assessment  Cervical Assessment Cervical Assessment: Within Functional Limits Thoracic Assessment Thoracic Assessment: Within Functional Limits Lumbar Assessment Lumbar Assessment: Within Functional Limits Postural Control Postural Control: Deficits on evaluation  Balance Balance Balance Assessed: Yes Dynamic Sitting Balance Dynamic Sitting - Level of Assistance: 6: Modified independent (Device/Increase time) Dynamic Standing Balance Dynamic Standing - Level of Assistance: 6: Modified independent (Device/Increase time) Extremity/Trunk Assessment RUE Assessment RUE Assessment: Within Functional Limits LUE Assessment LUE Assessment: Within Functional Limits   Tonny Branch 04/30/2019, 6:58 AM

## 2019-04-30 NOTE — Progress Notes (Signed)
Physical Therapy Discharge Summary  Patient Details  Name: Sarah Bennett MRN: [id number] Date of Birth: 24-Nov-1975  Today's Date: 04/30/2019 PT Individual Time: 1300-1410 PT Individual Time Calculation (min): 70 min   Pt received in supine and agreeable to therapy, no c/o pain. Pt performed bed mobility independently and stand pivot to w/c w/ RW, mod/i. Pt self-propelled w/c to/from therapy gym independently, also performed all w/c parts management w/o assist. Instructed pt on HEP as detailed below. Pt performed 1 set of each. Discussed progression exercises as indicated below as she does not have sufficient strength to perform yet. Educated on benefits of regular ice to LLE for swelling management, 20 min multiple times per day. Provided w/ written handout of HEP, icing instructions, and handout regarding swelling management. Reviewed swelling management techniques including elevation, ice, exercises/HEP, and avoiding placing leg in dependent position for long-term. Pt verbalized understanding and in agreement w/ all education. Returned to room and ended session in supine, all needs in reach.    LLE strengthening/ROM HEP, all to perform 1x/day and 3 sets of 10 each, unless otherwise indicated. Exercises to progress to are noted as well: -Supine Heel Slide  -Small Range Straight Leg Raise (progression to 3 sets of 10) -Ankle pump with strap assist  -Seated Ankle Plantarflexion with Resistance (orange theraband) -Seated Ankle Pumps (progression to 3 sets of 10)  -Seated Hamstring Stretch with Chair - 2-3 x daily - 7 x weekly - 1 sets - 1 reps - 5 min hold  -Seated Heel Slide  -Leg kick -Seated March   Patient has met 10 of 10 long term goals due to improved activity tolerance, improved balance, increased strength, increased range of motion, decreased pain and ability to compensate for deficits.  Patient to discharge at a wheelchair level Modified Independent for propulsion and transfers,  supervision for gait in household environment. Patient's care partner is independent to provide the necessary physical assistance at discharge. Pt's daughter has been educated on providing assist for gait, stair negotiation via shower seat technique, and car transfers. Both pt and daughter verbalize understanding that pt must remain w/c level when home by herself for safety as gait requires supervision. Pt also is very good at directing her care appropriately and requesting assistance w/ setting up her needs.   Reasons goals not met: n/a  Recommendation:  Patient will benefit from ongoing skilled PT services in home health setting to continue to advance safe functional mobility, address ongoing impairments in functional standing balance, endurance, LLE strengthening/ROM, and minimize fall risk.  Pt unable to receive [facility] services at this time. She has been provided w/ HEP for LLE strengthening/ROM. Instructed pt to request new PT referral once she can bear more weight (>TDWB) on LLE to progress standing and gait activities. She understands importance of HEP to improve and maintain LLE strength and ROM.   Equipment: w/c, RW, shower seat for stair negotiation  Reasons for discharge: treatment goals met and discharge from hospital  Patient/family agrees with progress made and goals achieved: Yes  PT Discharge Precautions/Restrictions Precautions Precaution Comments: TDWB only left LE; can put weight thru left wrist Required Braces or Orthoses: Other Brace Other Brace: L velcro wrist brace for comfort Restrictions Weight Bearing Restrictions: Yes LUE Weight Bearing: Weight bearing as tolerated LLE Weight Bearing: Touchdown weight bearing Vital Signs Therapy Vitals Temp: 98.3 F (36.8 C) Temp Source: Oral Pulse Rate: 70 Resp: 18 BP: 125/77 Patient Position (if appropriate): Lying Oxygen Therapy SpO2: 99 % O2 Device:  Room Air Vision/Perception  Perception Perception: Within Functional  Limits Praxis Praxis: Intact  Cognition Overall Cognitive Status: Within Functional Limits for tasks assessed Awareness: Appears intact Problem Solving: Appears intact Safety/Judgment: Appears intact Sensation Sensation Light Touch: Appears Intact Coordination Gross Motor Movements are Fluid and Coordinated: No Coordination and Movement Description: Impaired 2/2 muscle guarding on LLE Motor  Motor Motor: Within Functional Limits Motor - Skilled Clinical Observations: WFL. pain in LLE  Mobility Bed Mobility Supine to Sit: Independent with assistive device Sit to Supine: Independent with assistive device Transfers Sit to Stand: Independent with assistive device Stand to Sit: Independent with assistive device Stand Pivot Transfers: Independent with assistive device Transfer (Assistive device): Rolling walker Locomotion  Gait Ambulation: Yes Gait Assistance: Supervision/Verbal cueing Gait Distance (Feet): 50 Feet Assistive device: Rolling walker Gait Assistance Details: Verbal cues for precautions/safety;Verbal cues for safe use of DME/AE Gait Gait: Yes(antalgic gait as pt maintaining TDWB on LLE) Gait velocity: Decreased Stairs / Additional Locomotion Stairs: Yes Stairs Assistance: Minimal Assistance - Patient > 75% Stair Management Technique: One rail Left Number of Stairs: 8 Height of Stairs: 3 Wheelchair Mobility Wheelchair Mobility: Yes Wheelchair Assistance: Independent with Camera operator: Both upper extremities Wheelchair Parts Management: Independent Distance: 150'  Trunk/Postural Assessment  Cervical Assessment Cervical Assessment: Within Functional Limits Thoracic Assessment Thoracic Assessment: Within Functional Limits Lumbar Assessment Lumbar Assessment: Within Functional Limits Postural Control Postural Control: Deficits on evaluation  Balance Balance Balance Assessed: Yes Dynamic Sitting Balance Dynamic Sitting - Level  of Assistance: 6: Modified independent (Device/Increase time) Dynamic Standing Balance Dynamic Standing - Level of Assistance: 6: Modified independent (Device/Increase time) Extremity Assessment  RLE Assessment RLE Assessment: Within Functional Limits LLE Assessment LLE Assessment: Exceptions to [facility] Passive Range of Motion (PROM) Comments: Knee ROM limited 0-90 deg, ankle DF ROM limited to -5 deg General Strength Comments: Hip and knee 3+ to 4-/5, ankle 3/5. Limited by pain and edema    Sarah Bennett Armanda Forand 04/30/2019, 7:52 AM

## 2019-05-01 NOTE — Progress Notes (Signed)
Patient is being discharged today. D/C instructions have been given by Jeannene Patella, PA. Patient is being D/C to home. All equipment have been packed for D/C. Patient was accompanied by brother. No concerns to report at this time. [name], LPN

## 2019-05-01 NOTE — Plan of Care (Signed)
  Problem: Consults Goal: RH GENERAL PATIENT EDUCATION Description: See Patient Education module for education specifics. Outcome: Progressing   Problem: RH SAFETY Goal: RH STG ADHERE TO SAFETY PRECAUTIONS W/ASSISTANCE/DEVICE Description: STG Adhere to Safety Precautions With cues and reminders Outcome: Progressing Goal: RH STG DECREASED RISK OF FALL WITH ASSISTANCE Description: STG Decreased Risk of Fall With mod I assit Outcome: Progressing   Problem: RH PAIN MANAGEMENT Goal: RH STG PAIN MANAGED AT OR BELOW PT'S PAIN GOAL Description: Pain level less than 4 on scale of 0-10 Outcome: Progressing   Problem: RH KNOWLEDGE DEFICIT GENERAL Goal: RH STG INCREASE KNOWLEDGE OF SELF CARE AFTER HOSPITALIZATION Description: Pt will be able to adhere to safety precautions independently to prevent further injuries and complications.  Outcome: Progressing

## 2019-05-01 NOTE — Progress Notes (Signed)
Patient's sutures and staples have been removed. Patient tolerated removal. No complaints or concerns to report at this time. [name], LPN

## 2019-05-01 NOTE — Plan of Care (Signed)
  Problem: Consults Goal: RH GENERAL PATIENT EDUCATION Description: See Patient Education module for education specifics. 05/01/2019 1415 by Toy Cookey F, LPN Outcome: Completed/Met 05/01/2019 1043 by [name], LPN Outcome: Progressing   Problem: RH SAFETY Goal: RH STG ADHERE TO SAFETY PRECAUTIONS W/ASSISTANCE/DEVICE Description: STG Adhere to Safety Precautions With cues and reminders 05/01/2019 1415 by [name], LPN Outcome: Completed/Met 05/01/2019 1043 by [name], LPN Outcome: Progressing Goal: RH STG DECREASED RISK OF FALL WITH ASSISTANCE Description: STG Decreased Risk of Fall With mod I assit 05/01/2019 1415 by [name], LPN Outcome: Completed/Met 05/01/2019 1043 by [name], LPN Outcome: Progressing   Problem: RH PAIN MANAGEMENT Goal: RH STG PAIN MANAGED AT OR BELOW PT'S PAIN GOAL Description: Pain level less than 4 on scale of 0-10 05/01/2019 1415 by [name], LPN Outcome: Completed/Met 05/01/2019 1043 by [name], LPN Outcome: Progressing   Problem: RH KNOWLEDGE DEFICIT GENERAL Goal: RH STG INCREASE KNOWLEDGE OF SELF CARE AFTER HOSPITALIZATION Description: Pt will be able to adhere to safety precautions independently to prevent further injuries and complications.  05/01/2019 1415 by [name], LPN Outcome: Completed/Met 05/01/2019 1043 by [name], LPN Outcome: Progressing
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
## 2019-05-04 ENCOUNTER — Telehealth: Payer: Self-pay | Admitting: Registered Nurse
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
## 2019-05-12 ENCOUNTER — Other Ambulatory Visit: Payer: Self-pay

## 2019-05-12 ENCOUNTER — Encounter: Payer: Medicaid Other | Attending: Physical Medicine & Rehabilitation | Admitting: Physical Medicine & Rehabilitation

## 2019-05-12 ENCOUNTER — Encounter: Payer: Self-pay | Admitting: Physical Medicine & Rehabilitation

## 2019-05-12 VITALS — BP 116/76 | HR 73 | Temp 97.2°F | Ht 65.0 in

## 2019-05-12 DIAGNOSIS — G8918 Other acute postprocedural pain: Secondary | ICD-10-CM | POA: Diagnosis present

## 2019-05-12 DIAGNOSIS — S82402S Unspecified fracture of shaft of left fibula, sequela: Secondary | ICD-10-CM | POA: Diagnosis not present

## 2019-05-12 DIAGNOSIS — S82202S Unspecified fracture of shaft of left tibia, sequela: Secondary | ICD-10-CM

## 2019-05-12 DIAGNOSIS — S62112S Displaced fracture of triquetrum [cuneiform] bone, left wrist, sequela: Secondary | ICD-10-CM

## 2019-05-12 MED ORDER — OXYCODONE HCL 5 MG PO TABS: 5.0000 mg | ORAL_TABLET | Freq: Four times a day (QID) | ORAL | 0 refills | Status: AC | PRN

## 2019-05-12 MED ORDER — TRAMADOL HCL 50 MG PO TABS: 50.0000 mg | ORAL_TABLET | Freq: Four times a day (QID) | ORAL | 0 refills | Status: AC | PRN

## 2019-05-12 NOTE — Patient Instructions (Signed)
PLEASE FEEL FREE TO CALL OUR OFFICE WITH ANY PROBLEMS OR QUESTIONS ([phone])      
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
## 2019-05-20 ENCOUNTER — Other Ambulatory Visit: Payer: Self-pay

## 2019-05-20 ENCOUNTER — Ambulatory Visit (INDEPENDENT_AMBULATORY_CARE_PROVIDER_SITE_OTHER): Payer: Medicaid Other

## 2019-05-20 ENCOUNTER — Ambulatory Visit (INDEPENDENT_AMBULATORY_CARE_PROVIDER_SITE_OTHER): Payer: Medicaid Other | Admitting: Orthopaedic Surgery

## 2019-05-20 ENCOUNTER — Encounter: Payer: Self-pay | Admitting: Orthopaedic Surgery

## 2019-05-20 DIAGNOSIS — S82202F Unspecified fracture of shaft of left tibia, subsequent encounter for open fracture type IIIA, IIIB, or IIIC with routine healing: Secondary | ICD-10-CM

## 2019-05-20 DIAGNOSIS — S82402F Unspecified fracture of shaft of left fibula, subsequent encounter for open fracture type IIIA, IIIB, or IIIC with routine healing: Secondary | ICD-10-CM
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
## 2019-05-21 ENCOUNTER — Encounter (INDEPENDENT_AMBULATORY_CARE_PROVIDER_SITE_OTHER): Payer: Self-pay | Admitting: Primary Care

## 2019-05-21 ENCOUNTER — Telehealth (INDEPENDENT_AMBULATORY_CARE_PROVIDER_SITE_OTHER): Payer: Medicaid Other | Admitting: Primary Care

## 2019-05-21 ENCOUNTER — Other Ambulatory Visit: Payer: Self-pay

## 2019-05-21 DIAGNOSIS — Z09 Encounter for follow-up examination after completed treatment for conditions other than malignant neoplasm: Secondary | ICD-10-CM

## 2019-05-21 DIAGNOSIS — G894 Chronic pain syndrome: Secondary | ICD-10-CM | POA: Diagnosis not present

## 2019-05-21 DIAGNOSIS — Z7689 Persons encountering health services in other specified circumstances: Secondary | ICD-10-CM | POA: Diagnosis not present
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
## 2019-06-16 ENCOUNTER — Ambulatory Visit: Payer: Medicaid Other | Admitting: Orthopaedic Surgery

## 2019-06-23 ENCOUNTER — Encounter: Payer: Medicaid Other | Admitting: Registered Nurse

## 2019-06-30 ENCOUNTER — Ambulatory Visit (INDEPENDENT_AMBULATORY_CARE_PROVIDER_SITE_OTHER): Payer: Medicaid Other | Admitting: Orthopaedic Surgery

## 2019-06-30 ENCOUNTER — Encounter: Payer: Self-pay | Admitting: Orthopaedic Surgery

## 2019-06-30 ENCOUNTER — Other Ambulatory Visit: Payer: Self-pay

## 2019-06-30 ENCOUNTER — Ambulatory Visit (INDEPENDENT_AMBULATORY_CARE_PROVIDER_SITE_OTHER): Payer: Medicaid Other

## 2019-06-30 DIAGNOSIS — S82202F Unspecified fracture of shaft of left tibia, subsequent encounter for open fracture type IIIA, IIIB, or IIIC with routine healing: Secondary | ICD-10-CM

## 2019-06-30 DIAGNOSIS — S82402F Unspecified fracture of shaft of left fibula, subsequent encounter for open fracture type IIIA, IIIB, or IIIC with routine healing: Secondary | ICD-10-CM

## 2019-06-30 NOTE — Progress Notes (Signed)
The patient is now between 9 and 10 weeks status post open reduction-internal fixation of a left open tib-fib fracture.  She has intramedullary nail placed in the tibia.  She is ambulating with a walker.  She has been weightbearing as tolerated.  She does report less pain and improve mobility.  On examination of her left knee and leg, the wound over the anterior distal tibia is healed completely.  There is no residual evidence of infection at all.  She is certainly weak with dorsiflexion of her foot but overall looks better.  The knee is moving well.  2 views left tibia and fibula show that the fracture his laying down callus and starting to feel.  I do feel that there is better evidence that this will heal and not need any other exchange nailing or bone grafting.  I would like to have her continue weightbearing as tolerated.  We will see her back in 6 weeks with a repeat AP and lateral of the left tibia and fibula.

## 2019-07-05 ENCOUNTER — Ambulatory Visit: Payer: Medicaid Other | Admitting: Registered Nurse

## 2019-07-06 ENCOUNTER — Other Ambulatory Visit: Payer: Self-pay

## 2019-07-06 ENCOUNTER — Encounter: Payer: Self-pay | Admitting: Registered Nurse

## 2019-07-06 ENCOUNTER — Encounter: Payer: Medicaid Other | Attending: Physical Medicine & Rehabilitation | Admitting: Registered Nurse

## 2019-07-06 VITALS — BP 123/87 | HR 68 | Temp 98.3°F | Ht 65.0 in | Wt 227.4 lb

## 2019-07-06 DIAGNOSIS — Z5181 Encounter for therapeutic drug level monitoring: Secondary | ICD-10-CM

## 2019-07-06 DIAGNOSIS — G894 Chronic pain syndrome: Secondary | ICD-10-CM

## 2019-07-06 DIAGNOSIS — S82202S Unspecified fracture of shaft of left tibia, sequela: Secondary | ICD-10-CM

## 2019-07-06 DIAGNOSIS — G8918 Other acute postprocedural pain: Secondary | ICD-10-CM | POA: Insufficient documentation

## 2019-07-06 DIAGNOSIS — S62112S Displaced fracture of triquetrum [cuneiform] bone, left wrist, sequela: Secondary | ICD-10-CM | POA: Diagnosis not present

## 2019-07-06 DIAGNOSIS — S82402S Unspecified fracture of shaft of left fibula, sequela: Secondary | ICD-10-CM

## 2019-07-06 DIAGNOSIS — Z79891 Long term (current) use of opiate analgesic: Secondary | ICD-10-CM

## 2019-07-06 NOTE — Progress Notes (Signed)
Subjective:    Patient ID: Sarah Bennett, female    DOB: 1975/02/20, 44 y.o.   MRN: [id number]  HPI: Sarah Bennett is a 44 y.o. female who returns for follow up appointment of her fractures. She states her pain is located in her left lower extremity. She rates her pain 2, She reports she was given permission to apply weight bearing walking  and performing stretching exercises.   Pain Inventory Average Pain 3 Pain Right Now 2 My pain is aching  In the last 24 hours, has pain interfered with the following? General activity 5 Relation with others 5 Enjoyment of life 5 What TIME of day is your pain at its worst? varies Sleep (in general) Poor  Pain is worse with: walking, bending and some activites Pain improves with: rest Relief from Meds: 10  Mobility use a walker how many minutes can you walk? 2-5 ability to climb steps?  yes do you drive?  no  Function not employed: date last employed 04/18/19  Neuro/Psych trouble walking spasms  Prior Studies x-rays  Physicians involved in your care Any changes since last visit?  no   Family History  Problem Relation Age of Onset  . Stroke Father    Social History   Socioeconomic History  . Marital status: Unknown    Spouse name: Not on file  . Number of children: Not on file  . Years of education: Not on file  . Highest education level: Not on file  Occupational History  . Not on file  Tobacco Use  . Smoking status: Never Smoker  . Smokeless tobacco: Never Used  Vaping Use  . Vaping Use: Never used  Substance and Sexual Activity  . Alcohol use: Not Currently  . Drug use: Not Currently  . Sexual activity: Not on file  Other Topics Concern  . Not on file  Social History Narrative  . Not on file   Social Determinants of Health   Financial Resource Strain:   . Difficulty of Paying Living Expenses:   Food Insecurity:   . Worried About Charity fundraiser in the Last Year:   . Arboriculturist in the Last  Year:   Transportation Needs:   . Film/video editor (Medical):   Marland Kitchen Lack of Transportation (Non-Medical):   Physical Activity:   . Days of Exercise per Week:   . Minutes of Exercise per Session:   Stress:   . Feeling of Stress :   Social Connections:   . Frequency of Communication with Friends and Family:   . Frequency of Social Gatherings with Friends and Family:   . Attends Religious Services:   . Active Member of Clubs or Organizations:   . Attends Archivist Meetings:   Marland Kitchen Marital Status:    Past Surgical History:  Procedure Laterality Date  . CESAREAN SECTION    . I & D EXTREMITY Left 04/18/2019   IRRIGATION AND DEBRIDEMENT OPEN TIB/FIB (Left  . I & D EXTREMITY Left 04/18/2019   Procedure: IRRIGATION AND DEBRIDEMENT OPEN TIB/FIB;  Surgeon: Mcarthur Rossetti, MD;  Location: [facility];  Service: Orthopedics;  Laterality: Left;  . IM NAILING TIBIA Left 04/18/2019  . TIBIA IM NAIL INSERTION Left 04/18/2019   Procedure: INTRAMEDULLARY (IM) NAIL TIBIAL;  Surgeon: Mcarthur Rossetti, MD;  Location: [facility];  Service: Orthopedics;  Laterality: Left;   Past Medical History:  Diagnosis Date  . Medical history non-contributory    BP 123/87  Pulse 68   Temp 98.3 F (36.8 C)   Ht 5\' 5"  (1.651 m)   Wt 227 lb 6.4 oz (103.1 kg)   SpO2 98%   BMI 37.84 kg/m   Opioid Risk Score:   Fall Risk Score:  `1  Depression screen PHQ 2/9  Depression screen [facility] 2/9 07/06/2019 05/21/2019 05/12/2019  Decreased Interest 0 0 1  Down, Depressed, Hopeless 0 0 0  PHQ - 2 Score 0 0 1  Altered sleeping - 0 1  Tired, decreased energy - 0 3  Change in appetite - 0 3  Feeling bad or failure about yourself  - 0 0  Trouble concentrating - 0 0  Moving slowly or fidgety/restless - 0 0  Suicidal thoughts - 0 0  PHQ-9 Score - 0 8  Difficult doing work/chores - Not difficult at all -     Review of Systems  Musculoskeletal: Positive for gait problem.       Spasms  All other systems  reviewed and are negative.      Objective:   Physical Exam Vitals and nursing note reviewed.  Constitutional:      Appearance: Normal appearance.  Cardiovascular:     Rate and Rhythm: Normal rate and regular rhythm.     Pulses: Normal pulses.     Heart sounds: Normal heart sounds.  Pulmonary:     Effort: Pulmonary effort is normal.     Breath sounds: Normal breath sounds.  Musculoskeletal:     Cervical back: Normal range of motion and neck supple.     Comments: Normal Muscle Bulk and Muscle Testing Reveals:  Upper Extremities: Full ROM and Muscle Strength 5/5 Lower Extremities: Right: Full ROM and Muscle Strength 5/5 Left Lower Extremity: Decreased ROM and Muscle Strength 5/5 Arises from chair using walker for support Antalgic  Gait   Skin:    General: Skin is warm and dry.  Neurological:     Mental Status: She is alert and oriented to person, place, and time.  Psychiatric:        Mood and Affect: Mood normal.        Behavior: Behavior normal.           Assessment & Plan:  1. Closed chip fracture of triquetrum of left wrist: Ortho following. Continue to monitor.  2. Closed fracture of left tibia and fibula: Ortho following. Continue HEP as tolerated and instructed.   20 minutes of face to face patient care time was spent during this visit. All questions were encouraged and answered.  F/U in three months with Dr Naaman Plummer

## 2019-08-11 ENCOUNTER — Ambulatory Visit (INDEPENDENT_AMBULATORY_CARE_PROVIDER_SITE_OTHER): Payer: Medicaid Other | Admitting: Orthopaedic Surgery

## 2019-08-11 ENCOUNTER — Encounter: Payer: Self-pay | Admitting: Orthopaedic Surgery

## 2019-08-11 ENCOUNTER — Ambulatory Visit (INDEPENDENT_AMBULATORY_CARE_PROVIDER_SITE_OTHER): Payer: Medicaid Other

## 2019-08-11 DIAGNOSIS — S82402N Unspecified fracture of shaft of left fibula, subsequent encounter for open fracture type IIIA, IIIB, or IIIC with nonunion: Secondary | ICD-10-CM

## 2019-08-11 DIAGNOSIS — S82202N Unspecified fracture of shaft of left tibia, subsequent encounter for open fracture type IIIA, IIIB, or IIIC with nonunion: Secondary | ICD-10-CM
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
## 2019-09-27 ENCOUNTER — Ambulatory Visit: Payer: Medicaid Other | Admitting: Orthopaedic Surgery

## 2019-10-06 ENCOUNTER — Encounter: Payer: Medicaid Other | Attending: Physical Medicine & Rehabilitation | Admitting: Physical Medicine & Rehabilitation

## 2019-10-06 DIAGNOSIS — S82202S Unspecified fracture of shaft of left tibia, sequela: Secondary | ICD-10-CM | POA: Insufficient documentation

## 2019-10-06 DIAGNOSIS — G8918 Other acute postprocedural pain: Secondary | ICD-10-CM | POA: Insufficient documentation

## 2019-10-06 DIAGNOSIS — S82402S Unspecified fracture of shaft of left fibula, sequela: Secondary | ICD-10-CM | POA: Insufficient documentation

## 2019-10-06 DIAGNOSIS — S62112S Displaced fracture of triquetrum [cuneiform] bone, left wrist, sequela: Secondary | ICD-10-CM | POA: Insufficient documentation

## 2019-10-12 ENCOUNTER — Ambulatory Visit (INDEPENDENT_AMBULATORY_CARE_PROVIDER_SITE_OTHER): Payer: Medicaid Other | Admitting: Orthopaedic Surgery

## 2019-10-12 ENCOUNTER — Encounter: Payer: Self-pay | Admitting: Orthopaedic Surgery

## 2019-10-12 ENCOUNTER — Ambulatory Visit: Payer: Self-pay

## 2019-10-12 DIAGNOSIS — S82202N Unspecified fracture of shaft of left tibia, subsequent encounter for open fracture type IIIA, IIIB, or IIIC with nonunion: Secondary | ICD-10-CM

## 2019-10-12 DIAGNOSIS — S82402N Unspecified fracture of shaft of left fibula, subsequent encounter for open fracture type IIIA, IIIB, or IIIC with nonunion: Secondary | ICD-10-CM | POA: Diagnosis not present

## 2019-10-12 NOTE — Progress Notes (Signed)
The patient is a 44 year old female who is now 6 months status post intramedullary nail placement and a left open tib-fib fracture.  She relates with a cane and does have a limp.  She reports some pain at the scar site but otherwise has been doing much better and feeling better overall.  She has no acute change in medical status.  She denies any headache, chest pain, shortness of breath, fever, chills, nausea, vomiting.  Her medical status has been reviewed in detail.  Examination shows that she is ambulating with a cane.  She has a slight limp.  Her left tibia is examined and shows all wounds have healed nicely.  There is no evidence of infection.  There is no cellulitis or drainage or open wounds.  She is able to flex and extend her knee and flex and extend her ankle on the left side.  2 views of the tibia and fibula reviewed and the fracture line is still visible at the tibia.  The fibula is healed completely.  There is abundant callus formation around the tibia and compared to films from 2 months ago, there is been abundant callus formation.  I told her about the possibility of this going on to a nonunion and needing further surgery with bone grafting and exchange nail.  She would like [place] to hold off on this since the films are dramatically improved from the last visit 2 months ago.  She is of low physical demand and I agree with this treatment plan for now.  She will continue weightbearing as tolerated and I would like to see her back in 3 months with a repeat AP and lateral of the left tib-fib.

## 2020-01-12 ENCOUNTER — Ambulatory Visit: Payer: Medicaid Other | Admitting: Orthopaedic Surgery

## 2020-01-25 ENCOUNTER — Ambulatory Visit: Payer: Medicaid Other | Admitting: Orthopaedic Surgery

## 2020-04-17 ENCOUNTER — Ambulatory Visit: Payer: Medicaid Other | Admitting: Orthopaedic Surgery

## 2020-05-23 ENCOUNTER — Ambulatory Visit (INDEPENDENT_AMBULATORY_CARE_PROVIDER_SITE_OTHER): Payer: Medicaid Other | Admitting: Orthopaedic Surgery

## 2020-05-23 ENCOUNTER — Ambulatory Visit (INDEPENDENT_AMBULATORY_CARE_PROVIDER_SITE_OTHER): Payer: Medicaid Other

## 2020-05-23 ENCOUNTER — Encounter: Payer: Self-pay | Admitting: Orthopaedic Surgery

## 2020-05-23 ENCOUNTER — Other Ambulatory Visit: Payer: Self-pay

## 2020-05-23 DIAGNOSIS — S82202N Unspecified fracture of shaft of left tibia, subsequent encounter for open fracture type IIIA, IIIB, or IIIC with nonunion: Secondary | ICD-10-CM | POA: Diagnosis not present

## 2020-05-23 DIAGNOSIS — S82402N Unspecified fracture of shaft of left fibula, subsequent encounter for open fracture type IIIA, IIIB, or IIIC with nonunion: Secondary | ICD-10-CM | POA: Diagnosis not present

## 2020-05-23 NOTE — Progress Notes (Signed)
The patient is now 13 months status post intramedullary nail fixation of a left distal third open tib-fib fracture.  We are concerned about a delayed union.  She comes in today walking and pain-free.  She says she is moving to [place] soon and starting a job soon.  She has no issues at all.  She says she is doing very well.  She has full range of motion of her left knee and her left ankle.  The open wound site on the left distal third of the tibia is healed completely.  There is no deformity that she can see.  There is no pain on stressing the fracture site.  2 views the left tibia and fibula show the fracture is healed completely at this point and there is no complicating features of the hardware.  At this point follow-up can be as needed since she is doing well.  All questions and concerns were answered and addressed
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
